# Patient Record
Sex: Female | Born: 1985 | Race: White | Hispanic: No | Marital: Married | Smoking: Never smoker
Health system: Southern US, Community
[De-identification: ages and names within clinical notes are randomized; demographics above are authoritative.]

## PROBLEM LIST (undated history)

## (undated) ENCOUNTER — Inpatient Hospital Stay (HOSPITAL_COMMUNITY): Payer: Self-pay

## (undated) DIAGNOSIS — Z789 Other specified health status: Secondary | ICD-10-CM

## (undated) DIAGNOSIS — R1031 Right lower quadrant pain: Secondary | ICD-10-CM

## (undated) DIAGNOSIS — N9489 Other specified conditions associated with female genital organs and menstrual cycle: Secondary | ICD-10-CM

## (undated) HISTORY — PX: ANTERIOR CRUCIATE LIGAMENT REPAIR: SHX115

---

## 2013-08-05 NOTE — L&D Delivery Note (Signed)
Delivery Note At 4:18 PM a viable and healthy female was delivered via  (Presentation: ROA).  APGAR: 9, 9; weight pending.   Placenta status: spontaneous, intact.  Cord:  with the following complications: none.  Cord pH: na  Anesthesia:  epidural Episiotomy: none Lacerations: none Suture Repair: none Est. Blood Loss (mL): 200  Mom to postpartum.  Baby to Couplet care / Skin to Skin.  Herschell Dimesichard J Nicey Krah 12/02/2013, 4:26 PM

## 2013-10-21 LAB — OB RESULTS CONSOLE HEPATITIS B SURFACE ANTIGEN: Hepatitis B Surface Ag: NEGATIVE

## 2013-10-21 LAB — OB RESULTS CONSOLE ANTIBODY SCREEN: ANTIBODY SCREEN: NEGATIVE

## 2013-10-21 LAB — OB RESULTS CONSOLE HIV ANTIBODY (ROUTINE TESTING): HIV: NONREACTIVE

## 2013-10-21 LAB — OB RESULTS CONSOLE RUBELLA ANTIBODY, IGM: Rubella: IMMUNE

## 2013-10-21 LAB — OB RESULTS CONSOLE GC/CHLAMYDIA
Chlamydia: NEGATIVE
Gonorrhea: NEGATIVE

## 2013-10-21 LAB — OB RESULTS CONSOLE ABO/RH: RH TYPE: POSITIVE

## 2013-10-21 LAB — OB RESULTS CONSOLE RPR: RPR: NONREACTIVE

## 2013-11-03 LAB — OB RESULTS CONSOLE GBS: GBS: POSITIVE

## 2013-11-29 ENCOUNTER — Encounter (HOSPITAL_COMMUNITY): Payer: Self-pay | Admitting: *Deleted

## 2013-11-29 ENCOUNTER — Telehealth (HOSPITAL_COMMUNITY): Payer: Self-pay | Admitting: *Deleted

## 2013-11-29 ENCOUNTER — Other Ambulatory Visit: Payer: Self-pay | Admitting: Obstetrics and Gynecology

## 2013-11-29 NOTE — Telephone Encounter (Signed)
Preadmission screen  

## 2013-12-02 ENCOUNTER — Observation Stay (HOSPITAL_COMMUNITY): Payer: BC Managed Care – PPO | Admitting: Anesthesiology

## 2013-12-02 ENCOUNTER — Inpatient Hospital Stay (HOSPITAL_COMMUNITY)
Admission: AD | Admit: 2013-12-02 | Payer: BC Managed Care – PPO | Source: Ambulatory Visit | Admitting: Obstetrics and Gynecology

## 2013-12-02 ENCOUNTER — Observation Stay (HOSPITAL_COMMUNITY)
Admission: RE | Admit: 2013-12-02 | Discharge: 2013-12-03 | Disposition: A | Discharge status: Home / Self Care | Payer: BC Managed Care – PPO | Source: Ambulatory Visit | Attending: Obstetrics and Gynecology | Admitting: Obstetrics and Gynecology

## 2013-12-02 ENCOUNTER — Encounter (HOSPITAL_COMMUNITY): Payer: BC Managed Care – PPO | Admitting: Anesthesiology

## 2013-12-02 ENCOUNTER — Encounter (HOSPITAL_COMMUNITY): Payer: Self-pay

## 2013-12-02 DIAGNOSIS — Z22330 Carrier of Group B streptococcus: Secondary | ICD-10-CM | POA: Diagnosis not present

## 2013-12-02 DIAGNOSIS — O99892 Other specified diseases and conditions complicating childbirth: Secondary | ICD-10-CM | POA: Diagnosis not present

## 2013-12-02 DIAGNOSIS — O9989 Other specified diseases and conditions complicating pregnancy, childbirth and the puerperium: Principal | ICD-10-CM

## 2013-12-02 DIAGNOSIS — O99891 Other specified diseases and conditions complicating pregnancy: Secondary | ICD-10-CM | POA: Diagnosis present

## 2013-12-02 LAB — CBC
HEMATOCRIT: 40 % (ref 36.0–46.0)
HEMOGLOBIN: 13.8 g/dL (ref 12.0–15.0)
MCH: 31.2 pg (ref 26.0–34.0)
MCHC: 34.5 g/dL (ref 30.0–36.0)
MCV: 90.5 fL (ref 78.0–100.0)
Platelets: 130 10*3/uL — ABNORMAL LOW (ref 150–400)
RBC: 4.42 MIL/uL (ref 3.87–5.11)
RDW: 13.8 % (ref 11.5–15.5)
WBC: 11.3 10*3/uL — ABNORMAL HIGH (ref 4.0–10.5)

## 2013-12-02 LAB — ABO/RH: ABO/RH(D): O POS

## 2013-12-02 LAB — TYPE AND SCREEN
ABO/RH(D): O POS
ANTIBODY SCREEN: NEGATIVE

## 2013-12-02 LAB — OB RESULTS CONSOLE GC/CHLAMYDIA
Chlamydia: NEGATIVE
GC PROBE AMP, GENITAL: NEGATIVE

## 2013-12-02 LAB — RPR

## 2013-12-02 LAB — OB RESULTS CONSOLE HIV ANTIBODY (ROUTINE TESTING): HIV: NONREACTIVE

## 2013-12-02 LAB — OB RESULTS CONSOLE RPR: RPR: NONREACTIVE

## 2013-12-02 MED ORDER — TETANUS-DIPHTH-ACELL PERTUSSIS 5-2.5-18.5 LF-MCG/0.5 IM SUSP: 0.5000 mL | Freq: Once | INTRAMUSCULAR | Status: DC

## 2013-12-02 MED ORDER — OXYCODONE-ACETAMINOPHEN 5-325 MG PO TABS: 1.0000 | ORAL_TABLET | ORAL | Status: DC | PRN

## 2013-12-02 MED ORDER — DIBUCAINE 1 % RE OINT
1.0000 | TOPICAL_OINTMENT | RECTAL | Status: DC | PRN
Start: 2013-12-02 — End: 2013-12-03

## 2013-12-02 MED ORDER — ACETAMINOPHEN 325 MG PO TABS: 650.0000 mg | ORAL_TABLET | ORAL | Status: DC | PRN

## 2013-12-02 MED ORDER — PRENATAL MULTIVITAMIN CH
1.0000 | ORAL_TABLET | Freq: Every day | ORAL | Status: DC
Start: 2013-12-03 — End: 2013-12-03
  Administered 2013-12-03: 1 via ORAL
  Filled 2013-12-02: qty 1

## 2013-12-02 MED ORDER — ZOLPIDEM TARTRATE 5 MG PO TABS: 5.0000 mg | ORAL_TABLET | Freq: Every evening | ORAL | Status: DC | PRN

## 2013-12-02 MED ORDER — PHENYLEPHRINE 40 MCG/ML (10ML) SYRINGE FOR IV PUSH (FOR BLOOD PRESSURE SUPPORT)
PREFILLED_SYRINGE | INTRAVENOUS | Status: AC
  Filled 2013-12-02: qty 10

## 2013-12-02 MED ORDER — FENTANYL 2.5 MCG/ML BUPIVACAINE 1/10 % EPIDURAL INFUSION (WH - ANES)
INTRAMUSCULAR | Status: DC | PRN
  Administered 2013-12-02: 14 mL/h via EPIDURAL

## 2013-12-02 MED ORDER — METHYLERGONOVINE MALEATE 0.2 MG PO TABS: 0.2000 mg | ORAL_TABLET | ORAL | Status: DC | PRN

## 2013-12-02 MED ORDER — LIDOCAINE HCL (PF) 1 % IJ SOLN
INTRAMUSCULAR | Status: DC | PRN
  Administered 2013-12-02 (×2): 8 mL

## 2013-12-02 MED ORDER — OXYTOCIN 40 UNITS IN LACTATED RINGERS INFUSION - SIMPLE MED
1.0000 m[IU]/min | INTRAVENOUS | Status: DC
  Administered 2013-12-02: 2 m[IU]/min via INTRAVENOUS
  Filled 2013-12-02: qty 1000

## 2013-12-02 MED ORDER — PHENYLEPHRINE 40 MCG/ML (10ML) SYRINGE FOR IV PUSH (FOR BLOOD PRESSURE SUPPORT)
80.0000 ug | PREFILLED_SYRINGE | INTRAVENOUS | Status: DC | PRN
Start: 2013-12-02 — End: 2013-12-02
  Filled 2013-12-02: qty 2

## 2013-12-02 MED ORDER — IBUPROFEN 600 MG PO TABS
600.0000 mg | ORAL_TABLET | Freq: Four times a day (QID) | ORAL | Status: DC | PRN
  Administered 2013-12-02: 600 mg via ORAL
  Filled 2013-12-02: qty 1

## 2013-12-02 MED ORDER — WITCH HAZEL-GLYCERIN EX PADS
1.0000 | MEDICATED_PAD | CUTANEOUS | Status: DC | PRN
Start: 2013-12-02 — End: 2013-12-03

## 2013-12-02 MED ORDER — PHENYLEPHRINE 40 MCG/ML (10ML) SYRINGE FOR IV PUSH (FOR BLOOD PRESSURE SUPPORT)
80.0000 ug | PREFILLED_SYRINGE | INTRAVENOUS | Status: DC | PRN
  Filled 2013-12-02: qty 2

## 2013-12-02 MED ORDER — ONDANSETRON HCL 4 MG PO TABS: 4.0000 mg | ORAL_TABLET | ORAL | Status: DC | PRN

## 2013-12-02 MED ORDER — METHYLERGONOVINE MALEATE 0.2 MG/ML IJ SOLN: 0.2000 mg | INTRAMUSCULAR | Status: DC | PRN

## 2013-12-02 MED ORDER — FENTANYL 2.5 MCG/ML BUPIVACAINE 1/10 % EPIDURAL INFUSION (WH - ANES): 14.0000 mL/h | INTRAMUSCULAR | Status: DC | PRN

## 2013-12-02 MED ORDER — DIPHENHYDRAMINE HCL 25 MG PO CAPS: 25.0000 mg | ORAL_CAPSULE | Freq: Four times a day (QID) | ORAL | Status: DC | PRN

## 2013-12-02 MED ORDER — SIMETHICONE 80 MG PO CHEW
80.0000 mg | CHEWABLE_TABLET | ORAL | Status: DC | PRN
Start: 2013-12-02 — End: 2013-12-03

## 2013-12-02 MED ORDER — DIPHENHYDRAMINE HCL 50 MG/ML IJ SOLN: 12.5000 mg | INTRAMUSCULAR | Status: DC | PRN

## 2013-12-02 MED ORDER — ONDANSETRON HCL 4 MG/2ML IJ SOLN: 4.0000 mg | INTRAMUSCULAR | Status: DC | PRN

## 2013-12-02 MED ORDER — EPHEDRINE 5 MG/ML INJ
10.0000 mg | INTRAVENOUS | Status: DC | PRN
  Filled 2013-12-02: qty 2

## 2013-12-02 MED ORDER — FENTANYL 2.5 MCG/ML BUPIVACAINE 1/10 % EPIDURAL INFUSION (WH - ANES)
INTRAMUSCULAR | Status: AC
  Filled 2013-12-02: qty 125

## 2013-12-02 MED ORDER — CITRIC ACID-SODIUM CITRATE 334-500 MG/5ML PO SOLN: 30.0000 mL | ORAL | Status: DC | PRN

## 2013-12-02 MED ORDER — LIDOCAINE HCL (PF) 1 % IJ SOLN
30.0000 mL | INTRAMUSCULAR | Status: DC | PRN
  Filled 2013-12-02: qty 30

## 2013-12-02 MED ORDER — FLEET ENEMA 7-19 GM/118ML RE ENEM: 1.0000 | ENEMA | RECTAL | Status: DC | PRN

## 2013-12-02 MED ORDER — LANOLIN HYDROUS EX OINT
TOPICAL_OINTMENT | CUTANEOUS | Status: DC | PRN
Start: 2013-12-02 — End: 2013-12-03

## 2013-12-02 MED ORDER — BENZOCAINE-MENTHOL 20-0.5 % EX AERO: 1.0000 "application " | INHALATION_SPRAY | CUTANEOUS | Status: DC | PRN

## 2013-12-02 MED ORDER — OXYTOCIN 40 UNITS IN LACTATED RINGERS INFUSION - SIMPLE MED: 62.5000 mL/h | INTRAVENOUS | Status: DC

## 2013-12-02 MED ORDER — LACTATED RINGERS IV SOLN: 500.0000 mL | INTRAVENOUS | Status: DC | PRN

## 2013-12-02 MED ORDER — BUTORPHANOL TARTRATE 1 MG/ML IJ SOLN: 1.0000 mg | INTRAMUSCULAR | Status: DC | PRN

## 2013-12-02 MED ORDER — LACTATED RINGERS IV SOLN
INTRAVENOUS | Status: DC
  Administered 2013-12-02: 125 mL/h via INTRAVENOUS

## 2013-12-02 MED ORDER — LACTATED RINGERS IV SOLN: 500.0000 mL | Freq: Once | INTRAVENOUS | Status: DC

## 2013-12-02 MED ORDER — IBUPROFEN 600 MG PO TABS
600.0000 mg | ORAL_TABLET | Freq: Four times a day (QID) | ORAL | Status: DC
Start: 2013-12-03 — End: 2013-12-03
  Administered 2013-12-02 – 2013-12-03 (×4): 600 mg via ORAL
  Filled 2013-12-02 (×4): qty 1

## 2013-12-02 MED ORDER — EPHEDRINE 5 MG/ML INJ
INTRAVENOUS | Status: AC
  Filled 2013-12-02: qty 4

## 2013-12-02 MED ORDER — OXYTOCIN BOLUS FROM INFUSION
500.0000 mL | INTRAVENOUS | Status: DC
  Administered 2013-12-02: 500 mL via INTRAVENOUS

## 2013-12-02 MED ORDER — ONDANSETRON HCL 4 MG/2ML IJ SOLN
4.0000 mg | Freq: Four times a day (QID) | INTRAMUSCULAR | Status: DC | PRN
  Administered 2013-12-02: 4 mg via INTRAVENOUS
  Filled 2013-12-02: qty 2

## 2013-12-02 MED ORDER — TERBUTALINE SULFATE 1 MG/ML IJ SOLN: 0.2500 mg | Freq: Once | INTRAMUSCULAR | Status: DC | PRN

## 2013-12-02 MED ORDER — PENICILLIN G POTASSIUM 5000000 UNITS IJ SOLR
5.0000 10*6.[IU] | Freq: Once | INTRAVENOUS | Status: AC
  Administered 2013-12-02: 5 10*6.[IU] via INTRAVENOUS
  Filled 2013-12-02: qty 5

## 2013-12-02 MED ORDER — PENICILLIN G POTASSIUM 5000000 UNITS IJ SOLR
2.5000 10*6.[IU] | INTRAVENOUS | Status: DC
  Administered 2013-12-02 (×2): 2.5 10*6.[IU] via INTRAVENOUS
  Filled 2013-12-02 (×5): qty 2.5

## 2013-12-02 MED ORDER — SENNOSIDES-DOCUSATE SODIUM 8.6-50 MG PO TABS
2.0000 | ORAL_TABLET | ORAL | Status: DC
  Administered 2013-12-02: 2 via ORAL
  Filled 2013-12-02: qty 2

## 2013-12-02 NOTE — Anesthesia Procedure Notes (Addendum)
Epidural Patient location during procedure: OB Start time: 12/02/2013 8:30 AM End time: 12/02/2013 8:34 AM  Staffing Anesthesiologist: Leilani AbleHATCHETT, Heitor Steinhoff  Preanesthetic Checklist Completed: patient identified, surgical consent, pre-op evaluation, timeout performed, IV checked, risks and benefits discussed and monitors and equipment checked  Epidural Patient position: sitting Prep: site prepped and draped and DuraPrep Patient monitoring: continuous pulse ox and blood pressure Approach: midline Location: L3-L4 Injection technique: LOR air  Needle:  Needle type: Tuohy  Needle gauge: 17 G Needle length: 9 cm and 9 Needle insertion depth: 5 cm cm Catheter type: closed end flexible Catheter size: 19 Gauge Catheter at skin depth: 10 cm Test dose: negative and Other  Assessment Sensory level: T9 Events: blood not aspirated, injection not painful, no injection resistance, negative IV test and no paresthesia  Additional Notes Reason for block:procedure for pain

## 2013-12-02 NOTE — H&P (Signed)
[name] is a 28 y.o. female presenting for induction. Maternal Medical History:  Contractions: Frequency: rare.   Perceived severity is mild.    Fetal activity: Perceived fetal activity is normal.   Last perceived fetal movement was within the past hour.    Prenatal complications: no prenatal complications Prenatal Complications - Diabetes: none.    OB History   Grav Para Term Preterm Abortions TAB SAB Ect Mult Living   2 1 1       1      History reviewed. No pertinent past medical history. Past Surgical History  Procedure Laterality Date  . Anterior cruciate ligament repair     Family History: family history includes Cancer in her paternal grandfather; Heart attack in her paternal grandfather; Hypertension in her mother. Social History:  reports that she has never smoked. She does not have any smokeless tobacco history on file. She reports that she does not drink alcohol or use illicit drugs.   Prenatal Transfer Tool  Maternal Diabetes: No Genetic Screening: Normal Maternal Ultrasounds/Referrals: Normal Fetal Ultrasounds or other Referrals:  None Maternal Substance Abuse:  No Significant Maternal Medications:  None Significant Maternal Lab Results:  Lab values include: Group B Strep positive Other Comments:  None  Review of Systems  All other systems reviewed and are negative.     Blood pressure 113/70, pulse 93, temperature 98.1 F (36.7 C), temperature source Oral, resp. rate 18, height 5' (1.524 m), weight 64.864 kg (143 lb). Maternal Exam:  Uterine Assessment: Contraction strength is mild.  Contraction frequency is rare.   Abdomen: Patient reports no abdominal tenderness. Fetal presentation: vertex  Introitus: Normal vulva. Normal vagina.  Ferning test: not done.  Nitrazine test: not done. Amniotic fluid character: not assessed.  Pelvis: adequate for delivery.   Cervix: Cervix evaluated by digital exam.     Physical Exam  Constitutional: She is  oriented to person, place, and time. She appears well-developed and well-nourished.  HENT:  Head: Normocephalic and atraumatic.  Cardiovascular: Normal rate and regular rhythm.   Respiratory: Effort normal and breath sounds normal.  GI: Soft. Bowel sounds are normal.  Genitourinary: Vagina normal and uterus normal.  Neurological: She is alert and oriented to person, place, and time. She has normal reflexes.  Skin: Skin is warm and dry.  Psychiatric: She has a normal mood and affect. Her behavior is normal.    Prenatal labs: ABO, Rh: O/Positive/-- (03/19 0000) Antibody: Negative (03/19 0000) Rubella: Immune (03/19 0000) RPR: Nonreactive (03/19 0000)  HBsAg: Negative (03/19 0000)  HIV: Non-reactive (03/19 0000)  GBS: Positive (04/01 0000)   Assessment/Plan: Induction at 40 weeks History of precipitous labor   Laurie AdenRichard Wood [name] 12/02/2013, 7:12 AM

## 2013-12-02 NOTE — Progress Notes (Signed)
Gala LewandowskyJessica Ethridge is a 28 y.o. G2P1001 at 1590w0d by LMP admitted for induction of labor due to Prior precipitous labor and Elective at term.  Subjective: comfortable  Objective: BP 116/75  Pulse 74  Temp(Src) 98.2 F (36.8 C) (Oral)  Resp 20  Ht 5' (1.524 m)  Wt 64.864 kg (143 lb)  BMI 27.93 kg/m2  SpO2 99%      FHT:  FHR: 135 bpm, variability: moderate,  accelerations:  Present,  decelerations:  Absent UC:   regular, every 3 minutes SVE:   Dilation: 5 Effacement (%): 60 Station: -1 Exam by:: s grindstaff rn  Labs: Lab Results  Component Value Date   WBC 11.3* 12/02/2013   HGB 13.8 12/02/2013   HCT 40.0 12/02/2013   MCV 90.5 12/02/2013   PLT 130* 12/02/2013    Assessment / Plan: Induction of labor due to above noted indications,  progressing well on pitocin  Labor: Progressing normally Preeclampsia:  no signs or symptoms of toxicity Fetal Wellbeing:  Category I Pain Control:  Epidural I/D:  n/a Anticipated MOD:  NSVD  Lenoard AdenRichard J Wilhemenia Camba 12/02/2013, 1:41 PM

## 2013-12-02 NOTE — Anesthesia Preprocedure Evaluation (Signed)

## 2013-12-03 DIAGNOSIS — O99892 Other specified diseases and conditions complicating childbirth: Secondary | ICD-10-CM | POA: Diagnosis not present

## 2013-12-03 LAB — CBC
HEMATOCRIT: 37.7 % (ref 36.0–46.0)
HEMOGLOBIN: 12.9 g/dL (ref 12.0–15.0)
MCH: 31.2 pg (ref 26.0–34.0)
MCHC: 34.2 g/dL (ref 30.0–36.0)
MCV: 91.3 fL (ref 78.0–100.0)
Platelets: 103 10*3/uL — ABNORMAL LOW (ref 150–400)
RBC: 4.13 MIL/uL (ref 3.87–5.11)
RDW: 14 % (ref 11.5–15.5)
WBC: 12.8 10*3/uL — ABNORMAL HIGH (ref 4.0–10.5)

## 2013-12-03 LAB — CCBB MATERNAL DONOR DRAW

## 2013-12-03 MED ORDER — OXYCODONE-ACETAMINOPHEN 5-325 MG PO TABS: 1.0000 | ORAL_TABLET | ORAL | Status: DC | PRN

## 2013-12-03 MED ORDER — IBUPROFEN 600 MG PO TABS: 600.0000 mg | ORAL_TABLET | Freq: Four times a day (QID) | ORAL | Status: DC

## 2013-12-03 NOTE — Discharge Summary (Signed)
Obstetric Discharge Summary  Reason for Admission: Pt is a G2P2002 at 1723w0d  [facility] wks, with Dr. Billy Coastaavon as primary provider.  Medications on Admission: Prescriptions prior to admission  Medication Sig Dispense Refill  . calcium carbonate (TUMS - DOSED IN MG ELEMENTAL CALCIUM) 500 MG chewable tablet Chew 1 tablet by mouth as needed for indigestion or heartburn.      . polyethylene glycol (MIRALAX / GLYCOLAX) packet Take 17 g by mouth every other day.      . Prenatal Vit-Fe Fumarate-FA (PRENATAL MULTIVITAMIN) TABS tablet Take 1 tablet by mouth daily at 12 noon.        Prenatal Labs: ABO, Rh: --/--/O POS, O POS (04/30 0650) Antibody: NEG (04/30 0650) Rubella: Immune (03/19 0000)  RPR: NON REAC (04/30 0650)  HBsAg: Negative (03/19 0000)  HIV: Non-reactive (04/30 0000)  GTT : see transfer prenatal records GBS: Positive (04/01 0000)   Prenatal Procedures: ultrasound Intrapartum Procedures: spontaneous vaginal delivery Postpartum Procedures: none Complications-Operative and Postpartum: none  Labs: Hemoglobin  Date Value Ref Range Status  12/03/2013 12.9  12.0 - 15.0 g/dL Final     HCT  Date Value Ref Range Status  12/03/2013 37.7  36.0 - 46.0 % Final   Lab Results  Component Value Date   PLT 103* 12/03/2013    Newborn Data: Live born female  Birth Weight: 7 lb 12 oz (3515 g) APGAR: 9, 9  Home with mother.   Discharge Information: Date: 12/03/2013 Discharge Diagnoses:  Pt is a G2P2002 at 2523w0d S/P Term Pregnancy-delivered on 12/02/2013  Condition: stable Activity: pelvic rest Diet: routine Medications:    Medication List    ASK your doctor about these medications       calcium carbonate 500 MG chewable tablet  Commonly known as:  TUMS - dosed in mg elemental calcium  Chew 1 tablet by mouth as needed for indigestion or heartburn.     polyethylene glycol packet  Commonly known as:  MIRALAX / GLYCOLAX  Take 17 g by  mouth every other day.     prenatal multivitamin Tabs tablet  Take 1 tablet by mouth daily at 12 noon.       Instructions: [facility] instruction booklet has been given and reviewed Discharge to: home     Follow-up Information   Follow up with Lenoard AdenAAVON,RICHARD J, MD. Schedule an appointment as soon as possible for a visit in 6 weeks. (For postpartum visit)    Specialty:  Obstetrics and Gynecology   Contact information:   [address] [place] [id number] [phone]       Kenard Gowerolitta M Christy Ehrsam, MSN, CNM 12/03/2013, 7:00 PM

## 2013-12-03 NOTE — Discharge Instructions (Signed)
Postpartum Depression and Baby Blues °The postpartum period begins right after the birth of a baby. During this time, there is often a great amount of joy and excitement. It is also a time of considerable changes in the life of the parent(s). Regardless of how many times a mother gives birth, each child brings new challenges and dynamics to the family. It is not unusual to have feelings of excitement accompanied by confusing shifts in moods, emotions, and thoughts. All mothers are at risk of developing postpartum depression or the "baby blues." These mood changes can occur right after giving birth, or they may occur many months after giving birth. The baby blues or postpartum depression can be mild or severe. Additionally, postpartum depression can resolve rather quickly, or it can be a long-term condition. °CAUSES °Elevated hormones and their rapid decline are thought to be a main cause of postpartum depression and the baby blues. There are a number of hormones that radically change during and after pregnancy. Estrogen and progesterone usually decrease immediately after delivering your baby. The level of thyroid hormone and various cortisol steroids also rapidly drop. Other factors that play a major role in these changes include major life events and genetics.  °RISK FACTORS °If you have any of the following risks for the baby blues or postpartum depression, know what symptoms to watch out for during the postpartum period. Risk factors that may increase the likelihood of getting the baby blues or postpartum depression include: °· Having a personal or family history of depression. °· Having depression while being pregnant. °· Having premenstrual or oral contraceptive-associated mood issues. °· Having exceptional life stress. °· Having marital conflict. °· Lacking a social support network. °· Having a baby with special needs. °· Having health problems such as diabetes. °SYMPTOMS °Baby blues symptoms include: °· Brief  fluctuations in mood, such as going from extreme happiness to sadness. °· Decreased concentration. °· Difficulty sleeping. °· Crying spells, tearfulness. °· Irritability. °· Anxiety. °Postpartum depression symptoms typically begin within the first month after giving birth. These symptoms include: °· Difficulty sleeping or excessive sleepiness. °· Marked weight loss. °· Agitation. °· Feelings of worthlessness. °· Lack of interest in activity or food. °Postpartum psychosis is a very concerning condition and can be dangerous. Fortunately, it is rare. Displaying any of the following symptoms is cause for immediate medical attention. Postpartum psychosis symptoms include: °· Hallucinations and delusions. °· Bizarre or disorganized behavior. °· Confusion or disorientation. °DIAGNOSIS  °A diagnosis is made by an evaluation of your symptoms. There are no medical or lab tests that lead to a diagnosis, but there are various questionnaires that a caregiver may use to identify those with the baby blues, postpartum depression, or psychosis. Often times, a screening tool called the Edinburgh Postnatal Depression Scale is used to diagnose depression in the postpartum period.  °TREATMENT °The baby blues usually goes away on its own in 1 to 2 weeks. Social support is often all that is needed. You should be encouraged to get adequate sleep and rest. Occasionally, you may be given medicines to help you sleep.  °Postpartum depression requires treatment as it can last several months or longer if it is not treated. Treatment may include individual or group therapy, medicine, or both to address any social, physiological, and psychological factors that may play a role in the depression. Regular exercise, a healthy diet, rest, and social support may also be strongly recommended.  °Postpartum psychosis is more serious and needs treatment right away. Hospitalization is   often needed. °HOME CARE INSTRUCTIONS °· Get as much rest as you can. Nap  when the baby sleeps. °· Exercise regularly. Some women find yoga and walking to be beneficial. °· Eat a balanced and nourishing diet. °· Do little things that you enjoy. Have a cup of tea, take a bubble bath, read your favorite magazine, or listen to your favorite music. °· Avoid alcohol. °· Ask for help with household chores, cooking, grocery shopping, or running errands as needed. Do not try to do everything. °· Talk to people close to you about how you are feeling. Get support from your partner, family members, friends, or other new moms. °· Try to stay positive in how you think. Think about the things you are grateful for. °· Do not spend a lot of time alone. °· Only take medicine as directed by your caregiver. °· Keep all your postpartum appointments. °· Let your caregiver know if you have any concerns. °SEEK MEDICAL CARE IF: °You are having a reaction or problems with your medicine. °SEEK IMMEDIATE MEDICAL CARE IF: °· You have suicidal feelings. °· You feel you may harm the baby or someone else. °Document Released: 04/25/2004 Document Revised: 10/14/2011 Document Reviewed: 05/03/2013 °[employer]® Patient Information ©2014 [employer]. °Breastfeeding and Mastitis °Mastitis is inflammation of the breast tissue. It can occur in women who are breastfeeding. This can make breastfeeding painful. Mastitis will sometimes go away on its own. Your health care provider will help determine if treatment is needed. °CAUSES °Mastitis is often associated with a blocked milk (lactiferous) duct. This can happen when too much milk builds up in the breast. Causes of excess milk in the breast can include: °· Poor latch-on. If your baby is not latched onto the breast properly, she or he may not empty your breast completely while breastfeeding. °· Allowing too much time to pass between feedings. °· Wearing a bra or other clothing that is too tight. This puts extra pressure on the lactiferous ducts so milk does not flow through them  as it should. °Mastitis can also be caused by a bacterial infection. Bacteria may enter the breast tissue through cuts or openings in the skin. In women who are breastfeeding, this may occur because of cracked or irritated skin. Cracks in the skin are often caused when your baby does not latch on properly to the breast. °SIGNS AND SYMPTOMS °· Swelling, redness, tenderness, and pain in an area of the breast. °· Swelling of the glands under the arm on the same side. °· Fever may or may not accompany mastitis. °If an infection is allowed to progress, a collection of pus (abscess) may develop. °DIAGNOSIS  °Your health care provider can usually diagnose mastitis based on your symptoms and a physical exam. Tests may be done to help confirm the diagnosis. These may include: °· Removal of pus from the breast by applying pressure to the area. This pus can be examined in the lab to determine which bacteria are present. If an abscess has developed, the fluid in the abscess can be removed with a needle. This can also be used to confirm the diagnosis and determine the bacteria present. In most cases, pus will not be present. °· Blood tests to determine if your body is fighting a bacterial infection. °· Mammogram or ultrasound tests to rule out other problems or diseases. °TREATMENT  °Mastitis that occurs with breastfeeding will sometimes go away on its own. Your health care provider may choose to wait 24 hours after first seeing   you to decide whether a prescription medicine is needed. If your symptoms are worse after 24 hours, your health care provider will likely prescribe an antibiotic to treat the mastitis. He or she will determine which bacteria are most likely causing the infection and will then select an appropriate antibiotic. This is sometimes changed based on the results of tests performed to identify the bacteria, or if there is no response to the antibiotic selected. Antibiotics are usually given by mouth. You may  also be given medicine for pain. °HOME CARE INSTRUCTIONS °· Only take over-the-counter or prescription medicines for pain, fever, or discomfort as directed by your health care provider. °· If your health care provider prescribed an antibiotic, take the medicine as directed. Make sure you finish it even if you start to feel better. °· Do not wear a tight or underwire bra. Wear a soft, supportive bra. °· Increase your fluid intake, especially if you have a fever. °· Continue to empty the breast. Your health care provider can tell you whether this milk is safe for your infant or needs to be thrown out. You may be told to stop nursing until your health care provider thinks it is safe for your baby. Use a breast pump if you are advised to stop nursing. °· Keep your nipples clean and dry. °· Empty the first breast completely before going to the other breast. If your baby is not emptying your breasts completely for some reason, use a breast pump to empty your breasts. °· If you go back to work, pump your breasts while at work to stay in time with your nursing schedule. °· Avoid allowing your breasts to become overly filled with milk (engorged). °SEEK MEDICAL CARE IF: °· You have pus-like discharge from the breast. °· Your symptoms do not improve with the treatment prescribed by your health care provider within 2 days. °SEEK IMMEDIATE MEDICAL CARE IF: °· Your pain and swelling are getting worse. °· You have pain that is not controlled with medicine. °· You have a red line extending from the breast toward your armpit. °· You have a fever or persistent symptoms for more than 2 3 days. °· You have a fever and your symptoms suddenly get worse. °MAKE SURE YOU:  °· Understand these instructions. °· Will watch your condition. °· Will get help right away if you are not doing well or get worse. °Document Released: 11/16/2004 Document Revised: 03/24/2013 Document Reviewed: 02/25/2013 °[employer]® Patient Information ©2014 [employer]. °Breastfeeding °Deciding to breastfeed is one of the best choices you can make for you and your baby. A change in hormones during pregnancy causes your breast tissue to grow and increases the number and size of your milk ducts. These hormones also allow proteins, sugars, and fats from your blood supply to make breast milk in your milk-producing glands. Hormones prevent breast milk from being released before your baby is born as well as prompt milk flow after birth. Once breastfeeding has begun, thoughts of your baby, as well as his or her sucking or crying, can stimulate the release of milk from your milk-producing glands.  °BENEFITS OF BREASTFEEDING °For Your Baby °· Your first milk (colostrum) helps your baby's digestive system function better.   °· There are antibodies in your milk that help your baby fight off infections.   °· Your baby has a lower incidence of asthma, allergies, and sudden infant death syndrome.   °· The nutrients in breast milk are better for your baby than infant formulas and   are designed uniquely for your baby's needs.   °· Breast milk improves your baby's brain development.   °· Your baby is less likely to develop other conditions, such as childhood obesity, asthma, or type 2 diabetes mellitus.   °For You  °· Breastfeeding helps to create a very special bond between you and your baby.   °· Breastfeeding is convenient. Breast milk is always available at the correct temperature and costs nothing.   °· Breastfeeding helps to burn calories and helps you lose the weight gained during pregnancy.   °· Breastfeeding makes your uterus contract to its prepregnancy size faster and slows bleeding (lochia) after you give birth.   °· Breastfeeding helps to lower your risk of developing type 2 diabetes mellitus, osteoporosis, and breast or ovarian cancer later in life. °SIGNS THAT YOUR BABY IS HUNGRY °Early Signs of Hunger  °· Increased alertness or activity. °· Stretching. °· Movement of the head  from side to side. °· Movement of the head and opening of the mouth when the corner of the mouth or cheek is stroked (rooting). °· Increased sucking sounds, smacking lips, cooing, sighing, or squeaking. °· Hand-to-mouth movements. °· Increased sucking of fingers or hands. °Late Signs of Hunger °· Fussing. °· Intermittent crying. °Extreme Signs of Hunger °Signs of extreme hunger will require calming and consoling before your baby will be able to breastfeed successfully. Do not wait for the following signs of extreme hunger to occur before you initiate breastfeeding:   °· Restlessness. °· A loud, strong cry. °·  Screaming. °BREASTFEEDING BASICS °Breastfeeding Initiation °· Find a comfortable place to sit or lie down, with your neck and back well supported. °· Place a pillow or rolled up blanket under your baby to bring him or her to the level of your breast (if you are seated). Nursing pillows are specially designed to help support your arms and your baby while you breastfeed. °· Make sure that your baby's abdomen is facing your abdomen.   °· Gently massage your breast. With your fingertips, massage from your chest wall toward your nipple in a circular motion. This encourages milk flow. You may need to continue this action during the feeding if your milk flows slowly. °· Support your breast with 4 fingers underneath and your thumb above your nipple. Make sure your fingers are well away from your nipple and your baby's mouth.   °· Stroke your baby's lips gently with your finger or nipple.   °· When your baby's mouth is open wide enough, quickly bring your baby to your breast, placing your entire nipple and as much of the colored area around your nipple (areola) as possible into your baby's mouth.   °· More areola should be visible above your baby's upper lip than below the lower lip.   °· Your baby's tongue should be between his or her lower gum and your breast.   °· Ensure that your baby's mouth is correctly  positioned around your nipple (latched). Your baby's lips should create a seal on your breast and be turned out (everted). °· It is common for your baby to suck about 2 3 minutes in order to start the flow of breast milk. °Latching °Teaching your baby how to latch on to your breast properly is very important. An improper latch can cause nipple pain and decreased milk supply for you and poor weight gain in your baby. Also, if your baby is not latched onto your nipple properly, he or she may swallow some air during feeding. This can make your baby fussy. Burping your baby when you   switch breasts during the feeding can help to get rid of the air. However, teaching your baby to latch on properly is still the best way to prevent fussiness from swallowing air while breastfeeding. °Signs that your baby has successfully latched on to your nipple:    °· Silent tugging or silent sucking, without causing you pain.   °· Swallowing heard between every 3 4 sucks.   °·  Muscle movement above and in front of his or her ears while sucking.   °Signs that your baby has not successfully latched on to nipple:  °· Sucking sounds or smacking sounds from your baby while breastfeeding. °· Nipple pain. °If you think your baby has not latched on correctly, slip your finger into the corner of your baby's mouth to break the suction and place it between your baby's gums. Attempt breastfeeding initiation again. °Signs of Successful Breastfeeding °Signs from your baby:   °· A gradual decrease in the number of sucks or complete cessation of sucking.   °· Falling asleep.   °· Relaxation of his or her body.   °· Retention of a small amount of milk in his or her mouth.   °· Letting go of your breast by himself or herself. °Signs from you: °· Breasts that have increased in firmness, weight, and size 1 3 hours after feeding.   °· Breasts that are softer immediately after breastfeeding. °· Increased milk volume, as well as a change in milk consistency  and color by the 5th day of breastfeeding.   °· Nipples that are not sore, cracked, or bleeding. °Signs That Your Baby is Getting Enough Milk °· Wetting at least 3 diapers in a 24-hour period. The urine should be clear and pale yellow by age 5 days. °· At least 3 stools in a 24-hour period by age 5 days. The stool should be soft and yellow. °· At least 3 stools in a 24-hour period by age 7 days. The stool should be seedy and yellow. °· No loss of weight greater than 10% of birth weight during the first 3 days of age. °· Average weight gain of 4 7 ounces (120 210 mL) per week after age 4 days. °· Consistent daily weight gain by age 5 days, without weight loss after the age of 2 weeks. °After a feeding, your baby may spit up a small amount. This is common. °BREASTFEEDING FREQUENCY AND DURATION °Frequent feeding will help you make more milk and can prevent sore nipples and breast engorgement. Breastfeed when you feel the need to reduce the fullness of your breasts or when your baby shows signs of hunger. This is called "breastfeeding on demand." Avoid introducing a pacifier to your baby while you are working to establish breastfeeding (the first 4 6 weeks after your baby is born). After this time you may choose to use a pacifier. Research has shown that pacifier use during the first year of a baby's life decreases the risk of sudden infant death syndrome (SIDS). °Allow your baby to feed on each breast as long as he or she wants. Breastfeed until your baby is finished feeding. When your baby unlatches or falls asleep while feeding from the first breast, offer the second breast. Because newborns are often sleepy in the first few weeks of life, you may need to awaken your baby to get him or her to feed. °Breastfeeding times will vary from baby to baby. However, the following rules can serve as a guide to help you ensure that your baby is properly fed: °· Newborns (babies 4 weeks   of age or younger) may breastfeed every 1 3  hours. °· Newborns should not go longer than 3 hours during the day or 5 hours during the night without breastfeeding. °· You should breastfeed your baby a minimum of 8 times in a 24-hour period until you begin to introduce solid foods to your baby at around 6 months of age. °BREAST MILK PUMPING °Pumping and storing breast milk allows you to ensure that your baby is exclusively fed your breast milk, even at times when you are unable to breastfeed. This is especially important if you are going back to work while you are still breastfeeding or when you are not able to be present during feedings. Your lactation consultant can give you guidelines on how long it is safe to store breast milk.  °A breast pump is a machine that allows you to pump milk from your breast into a sterile bottle. The pumped breast milk can then be stored in a refrigerator or freezer. Some breast pumps are operated by hand, while others use electricity. Ask your lactation consultant which type will work best for you. Breast pumps can be purchased, but some hospitals and breastfeeding support groups lease breast pumps on a monthly basis. A lactation consultant can teach you how to hand express breast milk, if you prefer not to use a pump.  °CARING FOR YOUR BREASTS WHILE YOU BREASTFEED °Nipples can become dry, cracked, and sore while breastfeeding. The following recommendations can help keep your breasts moisturized and healthy: °· Avoid using soap on your nipples.   °· Wear a supportive bra. Although not required, special nursing bras and tank tops are designed to allow access to your breasts for breastfeeding without taking off your entire bra or top. Avoid wearing underwire style bras or extremely tight bras. °· Air dry your nipples for 3 4 minutes after each feeding.   °· Use only cotton bra pads to absorb leaked breast milk. Leaking of breast milk between feedings is normal.   °· Use lanolin on your nipples after breastfeeding. Lanolin helps to  maintain your skin's normal moisture barrier. If you use pure lanolin you do not need to wash it off before feeding your baby again. Pure lanolin is not toxic to your baby. You may also hand express a few drops of breast milk and gently massage that milk into your nipples and allow the milk to air dry. °In the first few weeks after giving birth, some women experience extremely full breasts (engorgement). Engorgement can make your breasts feel heavy, warm, and tender to the touch. Engorgement peaks within 3 5 days after you give birth. The following recommendations can help ease engorgement: °· Completely empty your breasts while breastfeeding or pumping. You may want to start by applying warm, moist heat (in the shower or with warm water-soaked hand towels) just before feeding or pumping. This increases circulation and helps the milk flow. If your baby does not completely empty your breasts while breastfeeding, pump any extra milk after he or she is finished. °· Wear a snug bra (nursing or regular) or tank top for 1 2 days to signal your body to slightly decrease milk production. °· Apply ice packs to your breasts, unless this is too uncomfortable for you. °· Make sure that your baby is latched on and positioned properly while breastfeeding. °If engorgement persists after 48 hours of following these recommendations, contact your health care provider or a lactation consultant. °OVERALL HEALTH CARE RECOMMENDATIONS WHILE BREASTFEEDING °· Eat healthy foods. Alternate between meals   and snacks, eating 3 of each per day. Because what you eat affects your breast milk, some of the foods may make your baby more irritable than usual. Avoid eating these foods if you are sure that they are negatively affecting your baby. °· Drink milk, fruit juice, and water to satisfy your thirst (about 10 glasses a day).   °· Rest often, relax, and continue to take your prenatal vitamins to prevent fatigue, stress, and anemia. °· Continue  breast self-awareness checks. °· Avoid chewing and smoking tobacco. °· Avoid alcohol and drug use. °Some medicines that may be harmful to your baby can pass through breast milk. It is important to ask your health care provider before taking any medicine, including all over-the-counter and prescription medicine as well as vitamin and herbal supplements. °It is possible to become pregnant while breastfeeding. If birth control is desired, ask your health care provider about options that will be safe for your baby. °SEEK MEDICAL CARE IF:  °· You feel like you want to stop breastfeeding or have become frustrated with breastfeeding. °· You have painful breasts or nipples. °· Your nipples are cracked or bleeding. °· Your breasts are red, tender, or warm. °· You have a swollen area on either breast. °· You have a fever or chills. °· You have nausea or vomiting. °· You have drainage other than breast milk from your nipples. °· Your breasts do not become full before feedings by the 5th day after you give birth. °· You feel sad and depressed. °· Your baby is too sleepy to eat well. °· Your baby is having trouble sleeping.   °· Your baby is wetting less than 3 diapers in a 24-hour period. °· Your baby has less than 3 stools in a 24-hour period. °· Your baby's skin or the white part of his or her eyes becomes yellow.   °· Your baby is not gaining weight by 5 days of age. °SEEK IMMEDIATE MEDICAL CARE IF:  °· Your baby is overly tired (lethargic) and does not want to wake up and feed. °· Your baby develops an unexplained fever. °Document Released: 07/22/2005 Document Revised: 03/24/2013 Document Reviewed: 01/13/2013 °[employer]® Patient Information ©2014 [employer]. ° °

## 2013-12-03 NOTE — Anesthesia Postprocedure Evaluation (Signed)
  Anesthesia Post-op Note  Patient: Laurie LewandowskyJessica Wood  Procedure(s) Performed: * No procedures listed *  Patient Location: Mother/Baby  Anesthesia Type:Epidural  Level of Consciousness: awake, oriented and patient cooperative  Airway and Oxygen Therapy: Patient Spontanous Breathing  Post-op Pain: none  Post-op Assessment: Patient's Cardiovascular Status Stable, Respiratory Function Stable, Patent Airway, No signs of Nausea or vomiting, Adequate PO intake, Pain level controlled, No headache, No backache, No residual numbness and No residual motor weakness  Post-op Vital Signs: Reviewed and stable  Last Vitals:  Filed Vitals:   12/03/13 0658  BP: 98/59  Pulse: 73  Temp: 36.3 C  Resp: 17    Complications: No apparent anesthesia complications

## 2013-12-03 NOTE — Progress Notes (Signed)
Patient ID: [name], female   DOB: 08/30/1985, 28 y.o.   MRN: [id number] PPD # 1 SVD  S:  Reports feeling tired and a little sore             Tolerating po/ No nausea or vomiting             Bleeding is light             Pain controlled with ibuprofen (OTC)             Up ad lib / ambulatory / voiding without difficulties    Newborn  Information for the patient's newborn:  [name], Girl Hadli [[id number]][[id number]]  female  breast feeding   O:  A & O x 3, in no apparent distress              VS:  Filed Vitals:   12/02/13 1859 12/02/13 1930 12/02/13 1959 12/03/13 0658  BP: 118/66 132/68 129/81 98/59  Pulse: 79 73 80 73  Temp:    97.4 F (36.3 C)  TempSrc:    Oral  Resp: 16 18 18 17   Height:      Weight:      SpO2:    100%    LABS:  Recent Labs  12/02/13 0650 12/03/13 0845  WBC 11.3* 12.8*  HGB 13.8 12.9  HCT 40.0 37.7  PLT 130* 103*    Blood type: --/--/O POS, O POS (04/30 0650)  Rubella: Immune (03/19 0000)   I&O: I/O last 3 completed shifts: In: -  Out: 850 [Urine:650; Blood:200]             Lungs: Clear and unlabored  Heart: regular rate and rhythm / no murmurs  Abdomen: soft, non-tender, non-distended             Fundus: firm, non-tender, U-2  Perineum: intact, no edema  Lochia: minimal  Extremities: no edema, no calf pain or tenderness, no Homans    A/P: PPD # 1  28 y.o., [id number]   Principal Problem:    Postpartum care following vaginal delivery (4/30)   Doing well - stable status  Routine post partum orders  Anticipate discharge tomorrow    Kenard Gowerolitta M Earnest Mcgillis, MSN, CNM 12/03/2013, 10:10 AM

## 2013-12-03 NOTE — Lactation Note (Signed)
This note was copied from the chart of Laurie Gala LewandowskyJessica Raske. Lactation Consultation Note Experienced BF mom for 1 yr. For 1st. Child. Denies needing assistance in BF. States going well. Mom made aware of O/P services, breastfeeding support groups, community resources, and our phone # for post-discharge questions. Mother informed of post-discharge support and given phone number to the lactation department, including services for phone call assistance; out-patient appointments; and breastfeeding support group. List of other breastfeeding resources in the community given in the handout. Encouraged mother to call for problems or concerns related to breastfeeding. Patient Name: [name] ZOXWR'UToday's Date: 12/03/2013     Maternal Data    Feeding Feeding Type: Breast Fed Length of feed: 7 min  [facility]Interventions                      Lactation Tools Discussed/Used     Consult Status      Charyl DancerLaura G Tyrisha Benninger 12/03/2013, 2:45 AM

## 2013-12-24 ENCOUNTER — Other Ambulatory Visit: Payer: Self-pay | Admitting: Obstetrics and Gynecology

## 2013-12-24 DIAGNOSIS — N6310 Unspecified lump in the right breast, unspecified quadrant: Secondary | ICD-10-CM

## 2013-12-30 ENCOUNTER — Ambulatory Visit
Admission: RE | Admit: 2013-12-30 | Discharge: 2013-12-30 | Disposition: A | Discharge status: Home / Self Care | Payer: BC Managed Care – PPO | Source: Ambulatory Visit | Attending: Obstetrics and Gynecology | Admitting: Obstetrics and Gynecology

## 2013-12-30 DIAGNOSIS — N6310 Unspecified lump in the right breast, unspecified quadrant: Secondary | ICD-10-CM

## 2014-06-06 ENCOUNTER — Encounter (HOSPITAL_COMMUNITY): Payer: Self-pay

## 2016-02-18 ENCOUNTER — Encounter (HOSPITAL_COMMUNITY)
Disposition: A | Discharge status: Home / Self Care | Payer: Self-pay | Source: Ambulatory Visit | Attending: Obstetrics & Gynecology

## 2016-02-18 ENCOUNTER — Observation Stay (HOSPITAL_COMMUNITY)
Admission: AD | Admit: 2016-02-18 | Discharge: 2016-02-19 | Disposition: A | Discharge status: Home / Self Care | Payer: BLUE CROSS/BLUE SHIELD | Source: Ambulatory Visit | Attending: Obstetrics and Gynecology | Admitting: Obstetrics and Gynecology

## 2016-02-18 ENCOUNTER — Inpatient Hospital Stay (HOSPITAL_COMMUNITY): Payer: BLUE CROSS/BLUE SHIELD

## 2016-02-18 ENCOUNTER — Encounter (HOSPITAL_COMMUNITY): Payer: Self-pay | Admitting: *Deleted

## 2016-02-18 ENCOUNTER — Inpatient Hospital Stay (HOSPITAL_COMMUNITY)
Admit: 2016-02-18 | Discharge: 2016-02-18 | Disposition: A | Discharge status: Home / Self Care | Payer: BLUE CROSS/BLUE SHIELD | Source: Ambulatory Visit | Attending: Obstetrics & Gynecology | Admitting: Obstetrics & Gynecology

## 2016-02-18 ENCOUNTER — Encounter (HOSPITAL_COMMUNITY): Payer: Self-pay | Admitting: Certified Nurse Midwife

## 2016-02-18 ENCOUNTER — Encounter (HOSPITAL_COMMUNITY): Payer: Self-pay | Admitting: Anesthesiology

## 2016-02-18 DIAGNOSIS — O3481 Maternal care for other abnormalities of pelvic organs, first trimester: Secondary | ICD-10-CM | POA: Insufficient documentation

## 2016-02-18 DIAGNOSIS — R1031 Right lower quadrant pain: Secondary | ICD-10-CM | POA: Diagnosis present

## 2016-02-18 DIAGNOSIS — R109 Unspecified abdominal pain: Secondary | ICD-10-CM

## 2016-02-18 DIAGNOSIS — N831 Corpus luteum cyst of ovary, unspecified side: Secondary | ICD-10-CM | POA: Diagnosis not present

## 2016-02-18 DIAGNOSIS — N9489 Other specified conditions associated with female genital organs and menstrual cycle: Secondary | ICD-10-CM | POA: Diagnosis present

## 2016-02-18 DIAGNOSIS — N949 Unspecified condition associated with female genital organs and menstrual cycle: Secondary | ICD-10-CM

## 2016-02-18 DIAGNOSIS — Z3A01 Less than 8 weeks gestation of pregnancy: Secondary | ICD-10-CM | POA: Insufficient documentation

## 2016-02-18 DIAGNOSIS — O26899 Other specified pregnancy related conditions, unspecified trimester: Secondary | ICD-10-CM

## 2016-02-18 DIAGNOSIS — R1900 Intra-abdominal and pelvic swelling, mass and lump, unspecified site: Secondary | ICD-10-CM | POA: Diagnosis present

## 2016-02-18 DIAGNOSIS — O0091 Unspecified ectopic pregnancy with intrauterine pregnancy: Secondary | ICD-10-CM

## 2016-02-18 HISTORY — DX: Other specified conditions associated with female genital organs and menstrual cycle: N94.89

## 2016-02-18 HISTORY — DX: Other specified health status: Z78.9

## 2016-02-18 HISTORY — DX: Right lower quadrant pain: R10.31

## 2016-02-18 LAB — CBC
HCT: 34.7 % — ABNORMAL LOW (ref 36.0–46.0)
HEMATOCRIT: 35.7 % — AB (ref 36.0–46.0)
HEMOGLOBIN: 12.3 g/dL (ref 12.0–15.0)
Hemoglobin: 11.9 g/dL — ABNORMAL LOW (ref 12.0–15.0)
MCH: 30.8 pg (ref 26.0–34.0)
MCH: 30.4 pg (ref 26.0–34.0)
MCHC: 34.5 g/dL (ref 30.0–36.0)
MCHC: 34.3 g/dL (ref 30.0–36.0)
MCV: 89.3 fL (ref 78.0–100.0)
MCV: 88.7 fL (ref 78.0–100.0)
PLATELETS: 216 10*3/uL (ref 150–400)
Platelets: 207 10*3/uL (ref 150–400)
RBC: 4 MIL/uL (ref 3.87–5.11)
RBC: 3.91 MIL/uL (ref 3.87–5.11)
RDW: 13.1 % (ref 11.5–15.5)
RDW: 13.1 % (ref 11.5–15.5)
WBC: 14.7 10*3/uL — ABNORMAL HIGH (ref 4.0–10.5)
WBC: 10 10*3/uL (ref 4.0–10.5)

## 2016-02-18 LAB — HIV ANTIBODY (ROUTINE TESTING W REFLEX): HIV Screen 4th Generation wRfx: NONREACTIVE

## 2016-02-18 LAB — CBC WITH DIFFERENTIAL/PLATELET
BASOS ABS: 0 10*3/uL (ref 0.0–0.1)
BASOS PCT: 0 %
EOS ABS: 0.1 10*3/uL (ref 0.0–0.7)
Eosinophils Relative: 1 %
HCT: 36.8 % (ref 36.0–46.0)
HEMOGLOBIN: 12.5 g/dL (ref 12.0–15.0)
Lymphocytes Relative: 19 %
Lymphs Abs: 2.3 10*3/uL (ref 0.7–4.0)
MCH: 30.4 pg (ref 26.0–34.0)
MCHC: 34 g/dL (ref 30.0–36.0)
MCV: 89.5 fL (ref 78.0–100.0)
MONOS PCT: 6 %
Monocytes Absolute: 0.7 10*3/uL (ref 0.1–1.0)
NEUTROS PCT: 74 %
Neutro Abs: 9.2 10*3/uL — ABNORMAL HIGH (ref 1.7–7.7)
PLATELETS: 231 10*3/uL (ref 150–400)
RBC: 4.11 MIL/uL (ref 3.87–5.11)
RDW: 13.2 % (ref 11.5–15.5)
WBC: 12.3 10*3/uL — AB (ref 4.0–10.5)

## 2016-02-18 LAB — URINALYSIS, ROUTINE W REFLEX MICROSCOPIC
BILIRUBIN URINE: NEGATIVE
GLUCOSE, UA: NEGATIVE mg/dL
HGB URINE DIPSTICK: NEGATIVE
Ketones, ur: NEGATIVE mg/dL
Leukocytes, UA: NEGATIVE
Nitrite: NEGATIVE
PH: 5.5 (ref 5.0–8.0)
Protein, ur: NEGATIVE mg/dL

## 2016-02-18 LAB — POCT PREGNANCY, URINE: Preg Test, Ur: POSITIVE — AB

## 2016-02-18 LAB — WET PREP, GENITAL
Clue Cells Wet Prep HPF POC: NONE SEEN
SPERM: NONE SEEN
Trich, Wet Prep: NONE SEEN
Yeast Wet Prep HPF POC: NONE SEEN

## 2016-02-18 LAB — HCG, QUANTITATIVE, PREGNANCY: HCG, BETA CHAIN, QUANT, S: 20831 m[IU]/mL — AB (ref <5)

## 2016-02-18 SURGERY — LAPAROSCOPY, WITH ECTOPIC PREGNANCY SURGICAL TREATMENT
Anesthesia: Choice

## 2016-02-18 MED ORDER — FAMOTIDINE IN NACL 20-0.9 MG/50ML-% IV SOLN: 20.0000 mg | Freq: Once | INTRAVENOUS | Status: DC

## 2016-02-18 MED ORDER — SOD CITRATE-CITRIC ACID 500-334 MG/5ML PO SOLN: 30.0000 mL | Freq: Once | ORAL | Status: DC

## 2016-02-18 MED ORDER — SODIUM CHLORIDE 0.9% FLUSH: 3.0000 mL | Freq: Two times a day (BID) | INTRAVENOUS | Status: DC

## 2016-02-18 MED ORDER — HYDROMORPHONE HCL 1 MG/ML IJ SOLN: 0.2000 mg | INTRAMUSCULAR | Status: DC | PRN

## 2016-02-18 MED ORDER — FENTANYL CITRATE (PF) 100 MCG/2ML IJ SOLN
INTRAMUSCULAR | Status: AC
  Filled 2016-02-18: qty 2

## 2016-02-18 MED ORDER — PRENATAL MULTIVITAMIN CH
1.0000 | ORAL_TABLET | Freq: Every day | ORAL | Status: DC
  Administered 2016-02-18: 1 via ORAL
  Filled 2016-02-18: qty 1

## 2016-02-18 MED ORDER — LACTATED RINGERS IV SOLN
INTRAVENOUS | Status: DC
  Administered 2016-02-18: 07:00:00 via INTRAVENOUS

## 2016-02-18 MED ORDER — SODIUM CHLORIDE 0.9% FLUSH: 3.0000 mL | INTRAVENOUS | Status: DC | PRN

## 2016-02-18 MED ORDER — PRENATAL MULTIVITAMIN CH: 1.0000 | ORAL_TABLET | Freq: Every day | ORAL | Status: DC

## 2016-02-18 MED ORDER — FENTANYL CITRATE (PF) 100 MCG/2ML IJ SOLN
50.0000 ug | Freq: Once | INTRAMUSCULAR | Status: AC
  Administered 2016-02-18: 50 ug via INTRAVENOUS

## 2016-02-18 MED ORDER — SODIUM CHLORIDE 0.9 % IV SOLN
250.0000 mL | INTRAVENOUS | Status: DC | PRN
  Administered 2016-02-18: 250 mL via INTRAVENOUS

## 2016-02-18 NOTE — Progress Notes (Signed)
Patient ID: Laurie LewandowskyJessica Wood, female   DOB: 08/09/1985, 30 y.o.   MRN: [id number] Subjective: NO PAIN. Mild cramps and may be RLQ slight pain if she tried to focus. No vaginal bleeding. Ambulating, voiding. No dizziness.   Objective: Vital signs in last 24 hours: Orthostatics negative Temp:  [97.5 F (36.4 C)-98.3 F (36.8 C)] 98.3 F (36.8 C) (07/16 1145) Pulse Rate:  [67-72] 67 (07/16 1145) Resp:  [12-18] 12 (07/16 1145) BP: (102-114)/(50-60) 103/60 mmHg (07/16 1145) SpO2:  [100 %] 100 % (07/16 1145) Weight:  [126 lb (57.153 kg)-126 lb 9.6 oz (57.425 kg)] 126 lb (57.153 kg) (07/16 0730) Weight change:  Last BM Date: 02/18/16  General appearance: alert and cooperative Resp: clear to auscultation bilaterally Cardio: regular rate and rhythm, S1, S2 normal, no murmur, click, rub or gallop GI: normal findings: bowel sounds normal, soft, non-tender, symmetric and no rebound/guarding Extremities: extremities normal, atraumatic, no cyanosis or edema Pulses: 2+ and symmetric  Pelvic: Anteverted uterus, no CMT or adnexal tenderness  Lab Results:  Recent Labs  02/18/16 0437 02/18/16 1041  WBC 14.7* 10.0  HGB 12.3 11.9*  HCT 35.7* 34.7*  PLT 207 216    Medications: None  Assessment/Plan: 6 wks intrauterine pregnancy with subchorionic hemorrhage and acute RLQ pain with right adnexal mass and pelvic complex fluid likely hemorrhage, most likely from hemorrhagic corpus luteum and extremely unlikely ectopic pregnancy. Patient's pain has resolved, exam is normal and H/H is stable.  Will give regular diet and discharge home, precautions and warning s/s incl pain returning, dizziness, shoulder pain, SOB etc reviewed. [facility]  F/up in 48 hrs in office, will schedule sono as well with Dr Billy Coastaavon.    [name] 02/18/2016, 12:47 PM

## 2016-02-18 NOTE — H&P (Signed)
[name] is a 30 y.o. female presenting for sudden acute RLQ pain this morning. Pt is G3P2002, [redacted] wks pregnant from LMP, planned spontaneous pregnancy. Prior 2 spontaneous pregnancies, vag deliveries, last is 30 yo. No h/o PID. No fertility medication use.  Pt called after-hrs number with c/o cramping on 7/15. She noted slight bleeding/spotting on 7/15 evening. She went to bed with mild cramps but not in any acute pain and woke up early morning with acute RLQ pain. She has some nausea but no vomiting, does not think nausea is from pain as it was present yesterday as well. She denies fever/ chills. She denies dizziness/ palpitations/ SOB/ upper abdomen or shoulder referred pain. Her pain has improved since its onset and has not received analgesic. Discharged this afternoon and returned this evening. The patient's primary symptoms include pelvic pain. This is a new but recurrent  problem. The current episode started today (1800 pain returned). The problem occurs constantly. The problem has been gradually worsening. Pain severity upon presentation 7/10 now 2/10 after IV Fentanyl. The problem affects the right side. She is pregnant. Associated symptoms include abdominal pain and nausea. Pertinent negatives include no chills, constipation, diarrhea, dysuria, fever, frequency, urgency or vomiting. Nothing aggravates the symptoms. She has tried nothing for the symptoms. Pain now improved after Fentanyl. CBC and sono are stable.    History OB History    Gravida Para Term Preterm AB TAB SAB Ectopic Multiple Living   3 2 2       2      Past Medical History  Diagnosis Date  . Medical history non-contributory   . RLQ abdominal pain 02/18/2016  . Adnexal mass 02/18/2016    Suspect hemorrhage in adnexa from right corpus luteum   Past Surgical History  Procedure Laterality Date  . Anterior cruciate ligament repair     Family History: family history includes Cancer in her paternal grandfather; Heart attack  in her paternal grandfather; Hypertension in her mother. Social History:  reports that she has never smoked. She has never used smokeless tobacco. She reports that she does not drink alcohol or use illicit drugs.  ROS above  Exam Physical Exam  BP 106/55 mmHg  Pulse 76  Temp(Src) 98.5 F (36.9 C) (Oral)  Resp 16  SpO2 100%  LMP 01/03/2016   A&O x 3, no acute distress. Pleasant HEENT neg, no thyromegaly Lungs CTA bilat CV RRR, S1S2 normal Abdo: Soft, very slightly tender in RLQ with no rebound or guarding. No distention. Non acute. Active BS. Can extend right leg without referred pain and no pain reported when bed shook or during transvaginal sonogram Extr no edema/ tenderness Pelvic deferring since I am suspected organized hemorrhage in right adnexa for possibly hemorrhagic corpus luteum  Prenatal labs: ABO, Rh: --/--/O POS (07/16 1948) Antibody: NEG (07/16 1948) CBC    Component Value Date/Time   WBC 12.3* 02/18/2016 1948   RBC 4.11 02/18/2016 1948   HGB 12.5 02/18/2016 1948   HCT 36.8 02/18/2016 1948   PLT 231 02/18/2016 1948   MCV 89.5 02/18/2016 1948   MCH 30.4 02/18/2016 1948   MCHC 34.0 02/18/2016 1948   RDW 13.2 02/18/2016 1948   LYMPHSABS 2.3 02/18/2016 1948   MONOABS 0.7 02/18/2016 1948   EOSABS 0.1 02/18/2016 1948   BASOSABS 0.0 02/18/2016 1948   Pelvic sono report and images reviewed:  Early IUP noted, gestational.sac 5.5 wks, Yolk sac noted, small fetal pole, cardiac activity not seen yet. Subchorionic hemorrhage 4 x  2.8 cm.  The ovaries appear normal. In the right adnexa, there is a heterogeneous mostly hyperechoic mass measuring 3 x 2.4 x 2.4 cm. Peripheral flow is demonstrated on color flow. Doppler imaging. Small central cystic collection. Appearance is worrisome for ectopic pregnancy. No definitive yolk sac or fetal pole was identified. Moderate amount of free fluid was demonstrated in the pelvis. Fluid contains internal echoes, possibly hemorrhagic.  Suspect heterotopic ectopic pregnancy. FU sono tonight with persistent but not enlarging mass.   Assessment/Plan: 30 yo, G3P2002, 6 wks pregnancy with early Intrauterine pregnancy 5.5 wks with YS, large [facility] and right adnexal 3x2.4 cm complex mass and complex pelvic fluid consistent with hemorrhage. Complex adnexal mass has peripheral flow and is separate from the ovary per radiology hence ectopic pregnancy is suspected. Stable sono and stable CBC tonight.  Patient is stable, H/H stable, VS stable with nonsurgical abdomen noted despite recurrent episode of pain tonight.  Differential  Diagnoses- Hemorrhage from corpus luteum with organized mass vs tubal ectopic abortion vs tubal pregnancy.   Plan to admit for observation and assess serially, Observation vs  option to proceed with diagnostic laparoscopy. Risks/ benefits reviewed. Patient is low risk for anesthesia and surgery but is also low risk for hetertopic pregnancy. Surgery and General anesthesia not without risk.  After counseling, we have planned to observe and reassess if surgery needed.   Patient and her husband voiced understanding.   Bristyl Mclees J 02/18/2016, 10:20 PM

## 2016-02-18 NOTE — MAU Note (Signed)
Pt was discharged earlier today and returns with severe onset right sided pain.

## 2016-02-18 NOTE — H&P (Signed)
[name] is a 30 y.o. female presenting for sudden acute RLQ pain this morning. Pt is G3P2002, [redacted] wks pregnant from LMP, planned spontaneous pregnancy. Prior 2 spontaneous pregnancies, vag deliveries, last is 30 yo. No h/o PID. No fertility medication use.  Pt called after-hrs number with c/o cramping on 7/15. She noted slight bleeding/spotting on 7/15 evening. She went to bed with mild cramps but not in any acute pain and woke up early morning with acute RLQ pain. She has some nausea but no vomiting, does not think nausea is from pain as it was present yesterday as well. She denies fever/ chills. She denies dizziness/ palpitations/ SOB/ upper abdomen or shoulder referred pain. Her pain has improved since its onset and has not received analgesic/   History OB History    Gravida Para Term Preterm AB TAB SAB Ectopic Multiple Living   Past Medical History  Diagnosis Date  . Medical history non-contributory   . RLQ abdominal pain 02/18/2016  . Adnexal mass 02/18/2016    Suspect hemorrhage in adnexa from right corpus luteum   Past Surgical History  Procedure Laterality Date  . Anterior cruciate ligament repair     Family History: family history includes Cancer in her paternal grandfather; Heart attack in her paternal grandfather; Hypertension in her mother. Social History:  reports that she has never smoked. She has never used smokeless tobacco. She reports that she does not drink alcohol or use illicit drugs.  ROS above  Exam Physical Exam  BP 114/50 mmHg  Pulse 71  Temp(Src) 97.5 F (36.4 C)  Resp 18  Ht 5' (1.524 m)  Wt 126 lb 9.6 oz (57.425 kg)  BMI 24.72 kg/m2  LMP 01/03/2016   A&O x 3, no acute distress. Pleasant HEENT neg, no thyromegaly Lungs CTA bilat CV RRR, S1S2 normal Abdo: Soft, very slightly tender in RLQ with no rebound or guarding. No distention. Non acute. Active BS. Can extend right leg without referred pain and no pain reported when bed shook  or during transvaginal sonogram Extr no edema/ tenderness Pelvic deferring since I am suspected organized hemorrhage in right adnexa for possibly hemorrhagic corpus luteum  Prenatal labs: ABO, Rh: --/--/O POS (07/16 0604) Antibody: NEG (07/16 0604) CBC    Component Value Date/Time   WBC 14.7* 02/18/2016 0437   RBC 4.00 02/18/2016 0437   HGB 12.3 02/18/2016 0437   HCT 35.7* 02/18/2016 0437   PLT 207 02/18/2016 0437   MCV 89.3 02/18/2016 0437   MCH 30.8 02/18/2016 0437   MCHC 34.5 02/18/2016 0437   RDW 13.1 02/18/2016 0437   Pelvic sono report and images reviewed:  Early IUP noted, gestational.sac 5.5 wks, Yolk sac noted, small fetal pole, cardiac activity not seen yet. Subchorionic hemorrhage 4 x 2.8 cm.  The ovaries appear normal. In the right adnexa, there is a heterogeneous mostly hyperechoic mass measuring 3 x 2.4 x 2.4 cm. Peripheral flow is demonstrated on color flow. Doppler imaging. Small central cystic collection. Appearance is worrisome for ectopic pregnancy. No definitive yolk sac or fetal pole was identified. Moderate amount of free fluid was demonstrated in the pelvis. Fluid contains internal echoes, possibly hemorrhagic. Suspect heterotopic ectopic pregnancy.    Assessment/Plan: 30 yo, G3P2002, 6 wks pregnancy with early Intrauterine pregnancy 5.5 wks with YS and right adnexal 3x2.4 cm complex mass and complex pelvic fluid consistent with hemorrhage. Complex adnexal mass has peripheral flow and  is separate from the ovary per radiology hence ectopic pregnancy is suspected. Patient is stable, H/H stable, vital stable and clinically no peritoneal signs noted.  So possible diagnoses: Hemorrhage from corpus luteum with organized mass vs tubal ectopic abortion vs tubal pregnancy.  Plan to admit for observation and assess serially, CBC at 10 am v/s option to proceed with diagnostic laparoscopy. Risks/ benefits reviewed. Patient is low risk for anesthesia and surgery. After  counseling, we have planned to observe and reassess if surgery needed.  Patient and her husband voiced understanding.   Korine Winton R 02/18/2016, 7:05 AM

## 2016-02-18 NOTE — Progress Notes (Signed)
Discharge teaching complete. Pt ambulated out of the hospital and discharged home to family.  

## 2016-02-18 NOTE — MAU Note (Signed)
Pt seen earlier today , returns now with worsening pain . Continued vaginal bleeding.

## 2016-02-18 NOTE — MAU Note (Signed)
Off and on RLQ pain since Friday. Occ cramping. NO bleeding. LMP 5/31. Pos home upt but has not been to doctor yet

## 2016-02-18 NOTE — Discharge Summary (Signed)
Physician Discharge Summary  Patient ID: Laurie LewandowskyJessica Wood MRN: [id number] DOB/AGE: 31/11/1985 31 y.o.  Admit date: 02/18/2016 Discharge date: 02/18/2016  Admission Diagnoses: Right Lower Quadrant pain, complex adnexal mass and complex adnexal fluid, intrauterine pregnancy with subchorionic hemorrhage, suspicion for coexisting ectopic pregnancy  Discharge Diagnoses:  Principal Problem:   RLQ abdominal pain Active Problems:   Adnexal mass   Pelvic mass in female Stable patient with clinical diagnosis of hemorrhagic corpus luteal cyst with pain and bleeding causing complex mass  Discharged Condition: Pelvic pain resolved without any medication  Hospital Course: Patient presented with acute RLQ pain and slight tinge of vaginal bleeding.   Significant Diagnostic Studies: Pelvic Sonogram   Treatments: Observation  Discharge Exam: Blood pressure 103/60, pulse 67, temperature 98.3 F (36.8 C), temperature source Oral, resp. rate 12, height 5' (1.524 m), weight 126 lb (57.153 kg), last menstrual period 01/03/2016, SpO2 100 %, unknown if currently breastfeeding. General appearance: alert and cooperative Resp: clear to auscultation bilaterally Cardio: regular rate and rhythm, S1, S2 normal, no murmur, click, rub or gallop GI: normal findings: bowel sounds normal, soft, non-tender, symmetric and no rebound/guarding Extremities: extremities normal, atraumatic, no cyanosis or edema Pulses: 2+ and symmetric  Pelvic: Anteverted uterus, no CMT or adnexal tenderness  Disposition: 01-Home or Self Care  Discharge Instructions    Call MD for:  difficulty breathing, headache or visual disturbances    Complete by:  As directed      Call MD for:  persistant dizziness or light-headedness    Complete by:  As directed      Call MD for:  persistant nausea and vomiting    Complete by:  As directed      Call MD for:  severe uncontrolled pain    Complete by:  As directed      Call MD for:    Complete by:   As directed   Period like heavy vaginal bleeding     Diet - low sodium heart healthy    Complete by:  As directed      Discharge instructions    Complete by:  As directed   Pelvic rest, no lifting more than 25 pounds, no moderate to heavy exercise until further evaluation in office. Call back [facility] if pain returned or is worse     Driving Restrictions    Complete by:  As directed   Do not drive until you have further evaluation in office with ultrasound     Increase activity slowly    Complete by:  As directed      Lifting restrictions    Complete by:  As directed   25 pounds     Sexual Activity Restrictions    Complete by:  As directed   None until further evaluation            Medication List    TAKE these medications        acetaminophen 500 MG tablet  Commonly known as:  TYLENOL  Take 500 mg by mouth every 6 (six) hours as needed for mild pain or headache.     prenatal multivitamin Tabs tablet  Take 1 tablet by mouth at bedtime.         Signed: Brooklee Michelin R 02/18/2016, 12:56 PM

## 2016-02-18 NOTE — Progress Notes (Signed)
Patient ID: Laurie LewandowskyJessica Wood, female   DOB: 10/20/1985, 30 y.o.   MRN: [id number] VS stable BP 113/60 mmHg  Pulse 71  Temp(Src) 98 F (36.7 C) (Oral)  Resp 16  Ht 5' (1.524 m)  Wt 126 lb (57.153 kg)  BMI 24.61 kg/m2  SpO2 100%  LMP 01/03/2016 Orthostatics negative.

## 2016-02-18 NOTE — MAU Provider Note (Signed)
History     CSN: [id number]  Arrival date and time: 02/18/16 [id number]   First Provider Initiated Contact with Patient 02/18/16 1952      Chief Complaint  Patient presents with  . Pelvic Pain   Pelvic Pain The patient's primary symptoms include pelvic pain. This is a new problem. The current episode started today (1800 pain returned). The problem occurs constantly. The problem has been gradually worsening. Pain severity now: 7/10  The problem affects the right side. She is pregnant. Associated symptoms include abdominal pain and nausea. Pertinent negatives include no chills, constipation, diarrhea, dysuria, fever, frequency, urgency or vomiting. Nothing aggravates the symptoms. She has tried nothing for the symptoms.    Past Medical History  Diagnosis Date  . Medical history non-contributory   . RLQ abdominal pain 02/18/2016  . Adnexal mass 02/18/2016    Suspect hemorrhage in adnexa from right corpus luteum    Past Surgical History  Procedure Laterality Date  . Anterior cruciate ligament repair      Family History  Problem Relation Age of Onset  . Hypertension Mother   . Heart attack Paternal Grandfather   . Cancer Paternal Grandfather     prostate    Social History  Substance Use Topics  . Smoking status: Never Smoker   . Smokeless tobacco: Never Used  . Alcohol Use: No    Allergies: No Known Allergies  Prescriptions prior to admission  Medication Sig Dispense Refill Last Dose  . acetaminophen (TYLENOL) 500 MG tablet Take 500 mg by mouth every 6 (six) hours as needed for mild pain or headache.   02/17/2016 at 1100  . Prenatal Vit-Fe Fumarate-FA (PRENATAL MULTIVITAMIN) TABS tablet Take 1 tablet by mouth at bedtime.    Past Week at Unknown time    Review of Systems  Constitutional: Negative for fever and chills.  Gastrointestinal: Positive for nausea and abdominal pain. Negative for vomiting, diarrhea and constipation.  Genitourinary: Positive for pelvic pain. Negative  for dysuria, urgency and frequency.   Physical Exam   Blood pressure 97/53, pulse 81, temperature 98.5 F (36.9 C), temperature source Oral, resp. rate 16, last menstrual period 01/03/2016, SpO2 100 %, unknown if currently breastfeeding.  Physical Exam  Nursing note and vitals reviewed. Constitutional: She is oriented to person, place, and time. She appears well-developed and well-nourished. She appears distressed.  Cardiovascular: Normal rate.   Respiratory: Effort normal.  GI: Soft. There is no tenderness. There is no rebound and no guarding.  Neurological: She is alert and oriented to person, place, and time.  Skin: Skin is warm and dry.  Psychiatric: She has a normal mood and affect.    MAU Course  Procedures  MDM 1955: D/W Dr Billy Coastaavon, will get CBC and pelvic US. He will likely keep her on the 3rd floor for obs.  2000 [facility] Tawnya CrookHogan, Heather Donovan  8:11 PM 02/18/2016   Ultrasound: IMPRESSION: Single intrauterine gestational again identified with large subchorionic hemorrhage.  Complex hypoechoic structure in the right adnexal region again noted with slightly increasing complex free pelvic fluid -suspicious for heterotopic/ concurrent ectopic pregnancy.  Results for orders placed or performed during the hospital encounter of 02/18/16 (from the past 24 hour(s))  CBC with Differential     Status: Abnormal   Collection Time: 02/18/16  7:48 PM  Result Value Ref Range   WBC 12.3 (H) 4.0 - 10.5 K/uL   RBC 4.11 3.87 - 5.11 MIL/uL   Hemoglobin 12.5 12.0 -  15.0 g/dL   HCT 16.1 09.6 - 04.5 %   MCV 89.5 78.0 - 100.0 fL   MCH 30.4 26.0 - 34.0 pg   MCHC 34.0 30.0 - 36.0 g/dL   RDW 40.9 81.1 - 91.4 %   Platelets 231 150 - 400 K/uL   Neutrophils Relative % 74 %   Neutro Abs 9.2 (H) 1.7 - 7.7 K/uL   Lymphocytes Relative 19 %   Lymphs Abs 2.3 0.7 - 4.0 K/uL   Monocytes Relative 6 %   Monocytes Absolute 0.7 0.1 - 1.0 K/uL   Eosinophils Relative  1 %   Eosinophils Absolute 0.1 0.0 - 0.7 K/uL   Basophils Relative 0 %   Basophils Absolute 0.0 0.0 - 0.1 K/uL  Type and screen [facility]     Status: None (Preliminary result)   Collection Time: 02/18/16  7:48 PM  Result Value Ref Range   ABO/RH(D) O POS    Antibody Screen PENDING    Sample Expiration 02/21/2016    2100 Pt reassessed; reports pain now a 2/10.  Dr. Billy Coast notified and reviewed ultrasound results, labs, and current pain level > admit to 3rd floor for observation (pain meds as needed) Assessment and Plan  Pelvic Pain - ? heterotopic/ concurrent ectopic pregnancy.  Plan: Admit to [facility]'s Unit Activity as tolerated Diet as tolerated IV Dilaudid prn  Marlis Edelson, CNM

## 2016-02-18 NOTE — MAU Provider Note (Signed)
History     CSN: [id number]  Arrival date and time: 02/18/16 1308   First Provider Initiated Contact with Patient 02/18/16 0423      Chief Complaint  Patient presents with  . Abdominal Pain   HPI   Ms.[name] is a 30 y.o. female G3P2002 @ [redacted]w[redacted]d here with right sided abdominal pain that started on Friday.  She feels that the pain has waxed and waned. The pain progressed and had gotten worse over the weekend and then stopped yesterday. She woke up this morning @ 0200 and the pain had returned. She currently rates her pain 3/10, the pain is in her RLQ.  She has not taken anything for the pain.  The patient is guarding her right side at this time.   She went to the bathroom while here in MAU and noted scant blood on the tissue.   She see's Dr. Billy Wood @ [facility].   OB History    Gravida Para Term Preterm AB TAB SAB Ectopic Multiple Living   3 2 2       2       Past Medical History  Diagnosis Date  . Medical history non-contributory     Past Surgical History  Procedure Laterality Date  . Anterior cruciate ligament repair      Family History  Problem Relation Age of Onset  . Hypertension Mother   . Heart attack Paternal Grandfather   . Cancer Paternal Grandfather     prostate    Social History  Substance Use Topics  . Smoking status: Never Smoker   . Smokeless tobacco: Never Used  . Alcohol Use: No    Allergies: No Known Allergies  Prescriptions prior to admission  Medication Sig Dispense Refill Last Dose  . calcium carbonate (TUMS - DOSED IN MG ELEMENTAL CALCIUM) 500 MG chewable tablet Chew 1 tablet by mouth as needed for indigestion or heartburn.   12/01/2013 at Unknown time  . ibuprofen (ADVIL,MOTRIN) 600 MG tablet Take 1 tablet (600 mg total) by mouth every 6 (six) hours. 30 tablet 1   . oxyCODONE-acetaminophen (PERCOCET/ROXICET) 5-325 MG per tablet Take 1-2 tablets by mouth every 4 (four) hours as needed for severe pain (moderate - severe pain). 30  tablet 0   . polyethylene glycol (MIRALAX / GLYCOLAX) packet Take 17 g by mouth every other day.   12/01/2013 at Unknown time  . Prenatal Vit-Fe Fumarate-FA (PRENATAL MULTIVITAMIN) TABS tablet Take 1 tablet by mouth daily at 12 noon.   2 days   Results for orders placed or performed during the hospital encounter of 02/18/16 (from the past 48 hour(s))  Urinalysis, Routine w reflex microscopic (not at [facility])     Status: Abnormal   Collection Time: 02/18/16  4:00 AM  Result Value Ref Range   Color, Urine YELLOW YELLOW   APPearance CLEAR CLEAR   Specific Gravity, Urine >1.030 (H) 1.005 - 1.030   pH 5.5 5.0 - 8.0   Glucose, UA NEGATIVE NEGATIVE mg/dL   Hgb urine dipstick NEGATIVE NEGATIVE   Bilirubin Urine NEGATIVE NEGATIVE   Ketones, ur NEGATIVE NEGATIVE mg/dL   Protein, ur NEGATIVE NEGATIVE mg/dL   Nitrite NEGATIVE NEGATIVE   Leukocytes, UA NEGATIVE NEGATIVE    Comment: MICROSCOPIC NOT DONE ON URINES WITH NEGATIVE PROTEIN, BLOOD, LEUKOCYTES, NITRITE, OR GLUCOSE <1000 mg/dL.  Pregnancy, urine POC     Status: Abnormal   Collection Time: 02/18/16  4:10 AM  Result Value Ref Range   Preg Test, Ur POSITIVE (  A) NEGATIVE    Comment:        THE SENSITIVITY OF THIS METHODOLOGY IS >24 mIU/mL   Wet prep, genital     Status: Abnormal   Collection Time: 02/18/16  4:31 AM  Result Value Ref Range   Yeast Wet Prep HPF POC NONE SEEN NONE SEEN   Trich, Wet Prep NONE SEEN NONE SEEN   Clue Cells Wet Prep HPF POC NONE SEEN NONE SEEN   WBC, Wet Prep HPF POC MANY (A) NONE SEEN    Comment: FEW BACTERIA SEEN   Sperm NONE SEEN   CBC     Status: Abnormal   Collection Time: 02/18/16  4:37 AM  Result Value Ref Range   WBC 14.7 (H) 4.0 - 10.5 K/uL   RBC 4.00 3.87 - 5.11 MIL/uL   Hemoglobin 12.3 12.0 - 15.0 g/dL   HCT 16.1 (L) 09.6 - 04.5 %   MCV 89.3 78.0 - 100.0 fL   MCH 30.8 26.0 - 34.0 pg   MCHC 34.5 30.0 - 36.0 g/dL   RDW 40.9 81.1 - 91.4 %   Platelets 207 150 - 400 K/uL  hCG, quantitative,  pregnancy     Status: Abnormal   Collection Time: 02/18/16  4:37 AM  Result Value Ref Range   hCG, Beta Chain, Quant, S 20831 (H) <5 mIU/mL    Comment:          GEST. AGE      CONC.  (mIU/mL)   <=1 WEEK        5 - 50     2 WEEKS       50 - 500     3 WEEKS       100 - 10,000     4 WEEKS     1,000 - 30,000     5 WEEKS     3,500 - 115,000   6-8 WEEKS     12,000 - 270,000    12 WEEKS     15,000 - 220,000        FEMALE AND NON-PREGNANT FEMALE:     LESS THAN 5 mIU/mL     Review of Systems  Constitutional: Negative for fever and chills.  Gastrointestinal: Positive for nausea, abdominal pain and diarrhea.   Physical Exam   Blood pressure 114/50, pulse 71, temperature 97.5 F (36.4 C), resp. rate 18, height 5' (1.524 m), weight 126 lb 9.6 oz (57.425 kg), last menstrual period 01/03/2016, unknown if currently breastfeeding.  Physical Exam  Constitutional: She is oriented to person, place, and time. She appears well-developed and well-nourished.  HENT:  Head: Normocephalic.  Eyes: Pupils are equal, round, and reactive to light.  GI: Soft. Normal appearance. There is tenderness in the right lower quadrant, periumbilical area and suprapubic area. There is rigidity and guarding. There is no rebound.  Genitourinary:  Speculum exam: Vagina - Small amount of pink discharge noted,  no odor Cervix - No contact bleeding, no active bleeding  Bimanual exam: Cervix closed Uterus non tender, normal size Right adnexal tenderness with suprapubic tenderness  Wet prep done Chaperone present for exam.  Musculoskeletal: Normal range of motion.  Neurological: She is alert and oriented to person, place, and time.  Skin: Skin is warm. She is not diaphoretic.  Psychiatric: Her behavior is normal.    MAU Course  Procedures  None  MDM  O positive blood type   0545: Discussed [place] with Radiologist [phone]: [name] called and asked to come into  MAU. Type and screen ordered, IV established NPO>  patient last ate at @ 2100 yesterday.   Dr. Juliene PinaMody at bedside and resumes care of the patient.   Assessment and Plan   Laurie LopeJennifer I Rasch, NP 02/18/2016 6:44 AM

## 2016-02-19 MED ORDER — OXYCODONE-ACETAMINOPHEN 5-325 MG PO TABS: 1.0000 | ORAL_TABLET | ORAL | Status: DC | PRN

## 2016-02-19 NOTE — Plan of Care (Signed)
Problem: Pain Managment: Goal: General experience of comfort will improve Outcome: Completed/Met Date Met:  02/19/16 Good pain control with IV Dilaudid.  Problem: Tissue Perfusion: Goal: Risk factors for ineffective tissue perfusion will decrease Outcome: Completed/Met Date Met:  02/19/16 VSS   Problem: Fluid Volume: Goal: Ability to maintain a balanced intake and output will improve Outcome: Completed/Met Date Met:  02/19/16 Good appetite no nausea or vomiting.Voids qs clear yellow urine.

## 2016-02-19 NOTE — Progress Notes (Signed)
Pt discharged to home with friend.  Condition stable.  Pt ambulated to car with Dolphus Jenny. Riley, NT.  No equipment for home ordered at discharge.

## 2016-02-19 NOTE — Progress Notes (Signed)
Subjective: Patient reports tolerating PO, + flatus and no problems voiding.   No further pain. Received Fentanyl x one last night at 1900 with no subsequent pain.  No nausea or vomiting.  Objective: Blood pressure 97/56, pulse 69, temperature 98.2 F (36.8 C), temperature source Oral, resp. rate 16, height 5' (1.524 m), weight 56.813 kg (125 lb 4 oz), last menstrual period 01/03/2016, SpO2 100 %, unknown if currently breastfeeding.  CBC    Component Value Date/Time   WBC 12.3* 02/18/2016 1948   RBC 4.11 02/18/2016 1948   HGB 12.5 02/18/2016 1948   HCT 36.8 02/18/2016 1948   PLT 231 02/18/2016 1948   MCV 89.5 02/18/2016 1948   MCH 30.4 02/18/2016 1948   MCHC 34.0 02/18/2016 1948   RDW 13.2 02/18/2016 1948   LYMPHSABS 2.3 02/18/2016 1948   MONOABS 0.7 02/18/2016 1948   EOSABS 0.1 02/18/2016 1948   BASOSABS 0.0 02/18/2016 1948    I have reviewed patient's vital signs, intake and output, medications and labs.  General: alert, cooperative and appears stated age Resp: clear to auscultation bilaterally and normal percussion bilaterally Cardio: regular rate and rhythm, S1, S2 normal, no murmur, click, rub or gallop GI: soft, non-tender; bowel sounds normal; no masses,  no organomegaly and normal findings: bowel sounds normal, soft, non-tender and symmetric Extremities: extremities normal, atraumatic, no cyanosis or edema and Homans sign is negative, no sign of DVT Vaginal Bleeding: none   Assessment/Plan: HD #1 s/p observation for RLQ pain . 3 day history of RLQ pain. Sono c/w IUP and ? Complex mass in right adnexa suspicious for ectopic vs hemorrhagic cyst.   3 episodes x 2d. No pain now. Stable CBC. Sono last night stable. Discussed with Dr. Ashley MurrainPoff (radiology). Imaging not definitive for ectopic. Results consistent with possible hemorrhagic cyst. No risk factors for ectopic. No ART. No history of PID or abdominal surgery. Clinically stable.  Pt desires DC at this time. Radiology  recommends rpt imaging in 24hr. Will arrange outpt sono tomorrow.. Roxicet #20 given. Pain Precautions and strict ectopic precautions.. Pt declines inpt observation at this time. DC teaching done.     [name] J 02/19/2016, 1:09 PM

## 2016-02-20 ENCOUNTER — Ambulatory Visit (HOSPITAL_COMMUNITY): Payer: BLUE CROSS/BLUE SHIELD | Admitting: Anesthesiology

## 2016-02-20 ENCOUNTER — Other Ambulatory Visit: Payer: Self-pay | Admitting: Obstetrics and Gynecology

## 2016-02-20 ENCOUNTER — Encounter (HOSPITAL_COMMUNITY): Payer: Self-pay

## 2016-02-20 ENCOUNTER — Encounter (HOSPITAL_COMMUNITY)
Admission: AD | Disposition: A | Discharge status: Home / Self Care | Payer: Self-pay | Source: Ambulatory Visit | Attending: Obstetrics and Gynecology

## 2016-02-20 ENCOUNTER — Ambulatory Visit (HOSPITAL_COMMUNITY)
Admission: AD | Admit: 2016-02-20 | Discharge: 2016-02-20 | Disposition: A | Discharge status: Home / Self Care | Payer: BLUE CROSS/BLUE SHIELD | Source: Ambulatory Visit | Attending: Obstetrics and Gynecology | Admitting: Obstetrics and Gynecology

## 2016-02-20 DIAGNOSIS — O008 Other ectopic pregnancy without intrauterine pregnancy: Secondary | ICD-10-CM | POA: Diagnosis not present

## 2016-02-20 DIAGNOSIS — R1031 Right lower quadrant pain: Secondary | ICD-10-CM | POA: Diagnosis present

## 2016-02-20 HISTORY — PX: LAPAROSCOPY: SHX197

## 2016-02-20 LAB — TYPE AND SCREEN
ABO/RH(D): O POS
ABO/RH(D): O POS
ABO/RH(D): O POS
ANTIBODY SCREEN: NEGATIVE
ANTIBODY SCREEN: NEGATIVE
Antibody Screen: NEGATIVE

## 2016-02-20 SURGERY — LAPAROSCOPY OPERATIVE
Anesthesia: General | Site: Abdomen

## 2016-02-20 MED ORDER — CEFAZOLIN SODIUM-DEXTROSE 2-4 GM/100ML-% IV SOLN
2.0000 g | INTRAVENOUS | Status: AC
  Administered 2016-02-20: 2 g via INTRAVENOUS

## 2016-02-20 MED ORDER — OXYCODONE-ACETAMINOPHEN 5-325 MG PO TABS: 1.0000 | ORAL_TABLET | ORAL | Status: DC | PRN

## 2016-02-20 MED ORDER — DEXAMETHASONE SODIUM PHOSPHATE 4 MG/ML IJ SOLN
INTRAMUSCULAR | Status: AC
  Filled 2016-02-20: qty 1

## 2016-02-20 MED ORDER — SUCCINYLCHOLINE CHLORIDE 200 MG/10ML IV SOSY
PREFILLED_SYRINGE | INTRAVENOUS | Status: DC | PRN
  Administered 2016-02-20: 80 mg via INTRAVENOUS

## 2016-02-20 MED ORDER — MIDAZOLAM HCL 5 MG/5ML IJ SOLN
INTRAMUSCULAR | Status: DC | PRN
  Administered 2016-02-20: 2 mg via INTRAVENOUS

## 2016-02-20 MED ORDER — PROPOFOL 10 MG/ML IV BOLUS
INTRAVENOUS | Status: AC
  Filled 2016-02-20: qty 20

## 2016-02-20 MED ORDER — FENTANYL CITRATE (PF) 100 MCG/2ML IJ SOLN
INTRAMUSCULAR | Status: AC
  Filled 2016-02-20: qty 2

## 2016-02-20 MED ORDER — ONDANSETRON HCL 4 MG/2ML IJ SOLN
INTRAMUSCULAR | Status: AC
  Filled 2016-02-20: qty 2

## 2016-02-20 MED ORDER — SODIUM CHLORIDE 0.9 % IJ SOLN
INTRAMUSCULAR | Status: AC
  Filled 2016-02-20: qty 10

## 2016-02-20 MED ORDER — FENTANYL CITRATE (PF) 250 MCG/5ML IJ SOLN
INTRAMUSCULAR | Status: AC
  Filled 2016-02-20: qty 5

## 2016-02-20 MED ORDER — LACTATED RINGERS IV SOLN: INTRAVENOUS | Status: DC

## 2016-02-20 MED ORDER — DEXAMETHASONE SODIUM PHOSPHATE 4 MG/ML IJ SOLN
INTRAMUSCULAR | Status: DC | PRN
  Administered 2016-02-20 (×2): 4 mg via INTRAVENOUS

## 2016-02-20 MED ORDER — FENTANYL CITRATE (PF) 100 MCG/2ML IJ SOLN
25.0000 ug | INTRAMUSCULAR | Status: DC | PRN
  Administered 2016-02-20 (×3): 25 ug via INTRAVENOUS

## 2016-02-20 MED ORDER — HEPARIN SODIUM (PORCINE) 5000 UNIT/ML IJ SOLN
INTRAMUSCULAR | Status: AC
  Filled 2016-02-20: qty 1

## 2016-02-20 MED ORDER — LIDOCAINE HCL (CARDIAC) 20 MG/ML IV SOLN
INTRAVENOUS | Status: AC
  Filled 2016-02-20: qty 5

## 2016-02-20 MED ORDER — BUPIVACAINE HCL (PF) 0.25 % IJ SOLN
INTRAMUSCULAR | Status: DC | PRN
  Administered 2016-02-20: 20 mL

## 2016-02-20 MED ORDER — PROPOFOL 10 MG/ML IV BOLUS
INTRAVENOUS | Status: DC | PRN
  Administered 2016-02-20: 200 mg via INTRAVENOUS

## 2016-02-20 MED ORDER — SUCCINYLCHOLINE CHLORIDE 20 MG/ML IJ SOLN
INTRAMUSCULAR | Status: AC
  Filled 2016-02-20: qty 1

## 2016-02-20 MED ORDER — ROCURONIUM BROMIDE 100 MG/10ML IV SOLN
INTRAVENOUS | Status: AC
  Filled 2016-02-20: qty 1

## 2016-02-20 MED ORDER — NEOSTIGMINE METHYLSULFATE 10 MG/10ML IV SOLN
INTRAVENOUS | Status: DC | PRN
  Administered 2016-02-20: 3.5 mg via INTRAVENOUS

## 2016-02-20 MED ORDER — ACETAMINOPHEN 160 MG/5ML PO SOLN: 975.0000 mg | Freq: Once | ORAL | Status: DC

## 2016-02-20 MED ORDER — LACTATED RINGERS IV SOLN
INTRAVENOUS | Status: DC
  Administered 2016-02-20 (×3): via INTRAVENOUS

## 2016-02-20 MED ORDER — FENTANYL CITRATE (PF) 100 MCG/2ML IJ SOLN
INTRAMUSCULAR | Status: DC | PRN
  Administered 2016-02-20: 100 ug via INTRAVENOUS
  Administered 2016-02-20: 50 ug via INTRAVENOUS
  Administered 2016-02-20 (×2): 25 ug via INTRAVENOUS
  Administered 2016-02-20: 50 ug via INTRAVENOUS

## 2016-02-20 MED ORDER — MIDAZOLAM HCL 2 MG/2ML IJ SOLN
INTRAMUSCULAR | Status: AC
  Filled 2016-02-20: qty 2

## 2016-02-20 MED ORDER — GLYCOPYRROLATE 0.2 MG/ML IJ SOLN
INTRAMUSCULAR | Status: DC | PRN
  Administered 2016-02-20: 0.6 mg via INTRAVENOUS

## 2016-02-20 MED ORDER — LACTATED RINGERS IR SOLN
Status: DC | PRN
  Administered 2016-02-20: 3000 mL

## 2016-02-20 MED ORDER — BUPIVACAINE HCL (PF) 0.25 % IJ SOLN
INTRAMUSCULAR | Status: AC
  Filled 2016-02-20: qty 30

## 2016-02-20 MED ORDER — ROCURONIUM BROMIDE 100 MG/10ML IV SOLN
INTRAVENOUS | Status: DC | PRN
  Administered 2016-02-20: 30 mg via INTRAVENOUS

## 2016-02-20 MED ORDER — SODIUM CHLORIDE 0.9 % IJ SOLN
INTRAMUSCULAR | Status: DC | PRN
  Administered 2016-02-20: 10 mL

## 2016-02-20 MED ORDER — LIDOCAINE 2% (20 MG/ML) 5 ML SYRINGE
INTRAMUSCULAR | Status: DC | PRN
  Administered 2016-02-20: 50 mg via INTRAVENOUS

## 2016-02-20 SURGICAL SUPPLY — 27 items
CABLE HIGH FREQUENCY MONO STRZ (ELECTRODE) IMPLANT
CATH ROBINSON RED A/P 16FR (CATHETERS) IMPLANT
CLOTH BEACON ORANGE TIMEOUT ST (SAFETY) ×2 IMPLANT
DRSG COVADERM PLUS 2X2 (GAUZE/BANDAGES/DRESSINGS) IMPLANT
DRSG OPSITE POSTOP 3X4 (GAUZE/BANDAGES/DRESSINGS) ×2 IMPLANT
FORCEPS CUTTING 33CM 5MM (CUTTING FORCEPS) ×2 IMPLANT
FORCEPS CUTTING 45CM 5MM (CUTTING FORCEPS) IMPLANT
GLOVE BIO SURGEON STRL SZ7.5 (GLOVE) ×2 IMPLANT
GLOVE BIOGEL PI IND STRL 7.0 (GLOVE) ×1 IMPLANT
GLOVE BIOGEL PI INDICATOR 7.0 (GLOVE) ×1
GOWN STRL REUS W/TWL LRG LVL3 (GOWN DISPOSABLE) ×2 IMPLANT
LIQUID BAND (GAUZE/BANDAGES/DRESSINGS) ×2 IMPLANT
NEEDLE INSUFFLATION 120MM (ENDOMECHANICALS) ×2 IMPLANT
PACK LAPAROSCOPY BASIN (CUSTOM PROCEDURE TRAY) ×2 IMPLANT
PAD TRENDELENBURG POSITION (MISCELLANEOUS) ×2 IMPLANT
POUCH SPECIMEN RETRIEVAL 10MM (ENDOMECHANICALS) ×4 IMPLANT
SET IRRIG TUBING LAPAROSCOPIC (IRRIGATION / IRRIGATOR) ×2 IMPLANT
SLEEVE XCEL OPT CAN 5 100 (ENDOMECHANICALS) ×2 IMPLANT
SOLUTION ELECTROLUBE (MISCELLANEOUS) IMPLANT
SUT VICRYL 0 UR6 27IN ABS (SUTURE) ×2 IMPLANT
SUT VICRYL 4-0 PS2 18IN ABS (SUTURE) ×2 IMPLANT
TOWEL OR 17X24 6PK STRL BLUE (TOWEL DISPOSABLE) ×4 IMPLANT
TRAY FOLEY CATH SILVER 14FR (SET/KITS/TRAYS/PACK) ×2 IMPLANT
TROCAR OPTI TIP 5M 100M (ENDOMECHANICALS) ×2 IMPLANT
TROCAR XCEL DIL TIP R 11M (ENDOMECHANICALS) ×2 IMPLANT
WARMER LAPAROSCOPE (MISCELLANEOUS) ×2 IMPLANT
WATER STERILE IRR 1000ML POUR (IV SOLUTION) ×2 IMPLANT

## 2016-02-20 NOTE — H&P (Signed)
NAMGala [name]:  [name], [name]               ACCOUNT NO.:  [id number]  MEDICAL RECORD NO.:  [id number]  LOCATION:  [facility]                          FACILITY:  [facility]  PHYSICIAN:  Lenoard Adenichard J. Tristy Udovich, M.D.DATE OF BIRTH:  02/05/1986  DATE OF ADMISSION:  02/20/2016 DATE OF DISCHARGE:                             HISTORY & PHYSICAL   CHIEF COMPLAINT:  Right lower quadrant pain.  HISTORY OF PRESENT ILLNESS:  She is a 30 year old white female, G3, P2, 6 weeks LMP with an intrauterine pregnancy and right adnexal mass.  The patient has presented twice to the emergency room with episodes of right lower quadrant pain.  Two ultrasounds performed in the hospital revealing findings suggestive of possible right heterotopic pregnancy. The patient has no history of artificial reproductive technology to get pregnant.  She has no history of PID, no history of abdominal surgery. The patient presented twice, was discharged home after pain improved. Followup ultrasound done by Dr. April [name] in his office yesterday evening suggests evolution, possibly a process with more definitive hematosalpinx noted by ultrasound.  The patient continued to be asymptomatic, but presents now for diagnostic evaluation of possible right heterotopic pregnancy.  ALLERGIES:  She has no known drug allergies.  MEDICATIONS:  Prenatal vitamins.  PAST MEDICAL HISTORY:  She has a history of vaginal delivery x2.  No history of abdominal surgery.  FAMILY HISTORY:  Noncontributory.  PAST SURGICAL HISTORY:  Noncontributory.  PHYSICAL EXAMINATION:  GENERAL:  Well-developed, well-nourished white female, in no acute distress. HEENT:  Normal. NECK:  Supple.  Full range of motion. LUNGS:  Clear. HEART:  Regular rate and rhythm. ABDOMEN:  Soft, nontender.  Uterus 6 weeks size.  Right adnexal tenderness.  No rebound.  No guarding. EXTREMITIES:  There are no cords. NEUROLOGIC:  Nonfocal. SKIN:  Intact.  IMPRESSION:  Right lower quadrant pain  with presumed right heterotopic pregnancy.  PLAN:  Proceed with diagnostic laparoscopy, possible right salpingectomy, possible right salpingostomy.  Risks of anesthesia, infection, bleeding, injury to surrounding organs, possible need for repair were discussed.  Delayed versus immediate complications to include bowel and bladder injury noted.  Of note, it is noted that the intrauterine pregnancy is viable, however, a large subchorionic hemorrhage has been noted.  The possibility of poor outcome of the coexisting intrauterine pregnancy has been discussed.  The patient acknowledges the risks of surgery, general anesthesia, risks and benefits have been discussed.  Consent signed.  The patient acknowledges and wishes to proceed.     Lenoard Adenichard J. Amaziah Raisanen, M.D.     RJT/MEDQ  D:  02/20/2016  T:  02/20/2016  Job:  [id number]

## 2016-02-20 NOTE — Transfer of Care (Signed)
Immediate Anesthesia Transfer of Care Note  Patient: [name]  Procedure(s) Performed: Procedure(s): LAPAROSCOPY OPERATIVE, Right Salpingectomy with Removal Ectopic Pregnancy (N/A)  Patient Location: PACU  Anesthesia Type:General  Level of Consciousness: awake, alert , oriented and patient cooperative  Airway & Oxygen Therapy: Patient Spontanous Breathing and Patient connected to nasal cannula oxygen  Post-op Assessment: Report given to RN and Post -op Vital signs reviewed and stable  Post vital signs: Reviewed and stable  Last Vitals:  Filed Vitals:   02/20/16 1313  BP: 109/70  Pulse: 75  Temp: 36.9 C  Resp: 18    Last Pain: There were no vitals filed for this visit.       Complications: No apparent anesthesia complications

## 2016-02-20 NOTE — Progress Notes (Signed)
Patient ID: Laurie LewandowskyJessica Gentzler, female   DOB: 12/04/1985, 30 y.o.   MRN: [id number] Patient seen and examined. Consent witnessed and signed. No changes noted. Update completed.

## 2016-02-20 NOTE — H&P (Signed)
[name] is an 30 y.o. female for Diag LS, poss right heterotopic pregnancy  Pertinent Gynecological History: Menses: flow is moderate Bleeding: intermenstrual bleeding Contraception: none DES exposure: denies Blood transfusions: none Sexually transmitted diseases: no past history Previous GYN Procedures: na  Last mammogram: normal Date: na Last pap: normal Date: na OB History: G3, P2   Menstrual History: Menarche age: [age]  Patient's last menstrual period was 01/03/2016.    Past Medical History  Diagnosis Date  . Medical history non-contributory   . RLQ abdominal pain 02/18/2016  . Adnexal mass 02/18/2016    Suspect hemorrhage in adnexa from right corpus luteum    Past Surgical History  Procedure Laterality Date  . Anterior cruciate ligament repair      Family History  Problem Relation Age of Onset  . Hypertension Mother   . Heart attack Paternal Grandfather   . Cancer Paternal Grandfather     prostate    Social History:  reports that she has never smoked. She has never used smokeless tobacco. She reports that she does not drink alcohol or use illicit drugs.  Allergies: No Known Allergies  Prescriptions prior to admission  Medication Sig Dispense Refill Last Dose  . acetaminophen (TYLENOL) 500 MG tablet Take 500 mg by mouth every 6 (six) hours as needed for mild pain.   Past Week at Unknown time  . Prenatal Vit-Fe Fumarate-FA (PRENATAL MULTIVITAMIN) TABS tablet Take 1 tablet by mouth at bedtime.    02/19/2016 at Unknown time  . oxyCODONE-acetaminophen (ROXICET) 5-325 MG tablet Take 1-2 tablets by mouth every 4 (four) hours as needed for severe pain. (Patient not taking: Reported on 02/20/2016) 20 tablet 0     Review of Systems  Constitutional: Negative.   All other systems reviewed and are negative.   Blood pressure 109/70, pulse 75, temperature 98.5 F (36.9 C), temperature source Oral, resp. rate 18, last menstrual period 01/03/2016, SpO2 100 %, unknown if  currently breastfeeding. Physical Exam  Nursing note and vitals reviewed. Constitutional: She is oriented to person, place, and time. She appears well-developed and well-nourished.  HENT:  Head: Normocephalic and atraumatic.  Cardiovascular: Normal rate and regular rhythm.   Respiratory: Effort normal and breath sounds normal.  GI: Soft. Bowel sounds are normal.  Genitourinary: Vagina normal and uterus normal.  Neurological: She is alert and oriented to person, place, and time.  Skin: Skin is warm and dry.    No results found for this or any previous visit (from the past 24 hour(s)).  [place] Ob Transvaginal  02/18/2016  CLINICAL DATA:  30 year old pregnant female with right pelvic pain. Findings suspicious for heterotopic pregnancy on ultrasound earlier today. EXAM: TRANSVAGINAL OB ULTRASOUND TECHNIQUE: Transvaginal ultrasound was performed for complete evaluation of the gestation as well as the maternal uterus, adnexal regions, and pelvic cul-de-sac. COMPARISON:  02/18/2016 this morning. FINDINGS: Intrauterine gestational sac: Visualize Yolk sac:  Visualized Embryo:  Visualized Cardiac Activity: Possible CRL:   1.8  mm Subchorionic hemorrhage:  Large subchorionic hemorrhage. Maternal uterus/adnexae: A 2.7 x 1.8 x 1.9 cm complex hyperechoic structure in the right adnexal region is noted with slightly increasing complex free pelvic fluid -suspicious for ectopic pregnancy. No other changes noted. IMPRESSION: Single intrauterine gestational again identified with large subchorionic hemorrhage. Complex hypoechoic structure in the right adnexal region again noted with slightly increasing complex free pelvic fluid -suspicious for heterotopic/ concurrent ectopic pregnancy. Electronically Signed   By: Harmon PierJeffrey  Hu M.D.   On: 02/18/2016 20:51  Assessment/Plan: Right heterotopic pregnancy with intermittent RLQ pain Diag LS, poss right salpingectomy Consent done  Kelli Egolf J 02/20/2016, 1:22 PM

## 2016-02-20 NOTE — Anesthesia Preprocedure Evaluation (Signed)
Anesthesia Evaluation  Patient identified by MRN, date of birth, ID band Patient awake    Reviewed: Allergy & Precautions, H&P , Patient's Chart, lab work & pertinent test results, reviewed documented beta blocker date and time   Airway Mallampati: II  TM Distance: >3 FB Neck ROM: full    Dental no notable dental hx.    Pulmonary    Pulmonary exam normal breath sounds clear to auscultation       Cardiovascular  Rhythm:regular Rate:Normal     Neuro/Psych    GI/Hepatic   Endo/Other    Renal/GU      Musculoskeletal   Abdominal   Peds  Hematology   Anesthesia Other Findings   Reproductive/Obstetrics                             Anesthesia Physical Anesthesia Plan  ASA: II  Anesthesia Plan: General   Post-op Pain Management:    Induction: Intravenous  Airway Management Planned: Oral ETT  Additional Equipment:   Intra-op Plan:   Post-operative Plan: Extubation in OR  Informed Consent: I have reviewed the patients History and Physical, chart, labs and discussed the procedure including the risks, benefits and alternatives for the proposed anesthesia with the patient or authorized representative who has indicated his/her understanding and acceptance.   Dental Advisory Given and Dental advisory given  Plan Discussed with: CRNA and Surgeon  Anesthesia Plan Comments: (  Discussed general anesthesia, including possible nausea, instrumentation of airway, sore throat,pulmonary aspiration, etc. I asked if the were any outstanding questions, or  concerns before we proceeded.)        Anesthesia Quick Evaluation  

## 2016-02-20 NOTE — Op Note (Addendum)
02/20/2016  4:03 PM  PATIENT:  Laurie LewandowskyJessica [name]  30 y.o. female  PRE-OPERATIVE DIAGNOSIS:  RLQ pain, Right heterotopic pregnancy  POST-OPERATIVE DIAGNOSIS:  same  PROCEDURE:  Procedure(s): LAPAROSCOPY OPERATIVE, Right Salpingectomy with Removal Ectopic Pregnancy Evacuation of hemo peritoneum Lysis of right peritubal adhesions  SURGEON:  Surgeon(s): Olivia Mackieichard Davari Lopes, MD Genia DelMarie-Lyne Lavoie, MD  ASSISTANTS:  Seymour BarsLavoie, MD  ANESTHESIA:   local and general  ESTIMATED BLOOD LOSS: 50 cc hemoperitoneum , minmal loss with case  DRAINS: Urinary Catheter (Foley)   LOCAL MEDICATIONS USED:  MARCAINE    and Amount: 20 ml  SPECIMEN:  Source of Specimen:  right tube with blood and presumed ectopic  DISPOSITION OF SPECIMEN:  PATHOLOGY  COUNTS:  YES  DICTATION #: [id number]  PLAN OF CARE: dc home  PATIENT DISPOSITION:  PACU - hemodynamically stable.

## 2016-02-20 NOTE — Discharge Instructions (Addendum)

## 2016-02-20 NOTE — Anesthesia Postprocedure Evaluation (Signed)
Anesthesia Post Note  Patient: Laurie LewandowskyJessica Larocca  Procedure(s) Performed: Procedure(s) (LRB): LAPAROSCOPY OPERATIVE, Right Salpingectomy with Removal Ectopic Pregnancy (N/A)  Patient location during evaluation: PACU Anesthesia Type: General Level of consciousness: awake and alert Pain management: pain level controlled Vital Signs Assessment: post-procedure vital signs reviewed and stable Respiratory status: spontaneous breathing, nonlabored ventilation, respiratory function stable and patient connected to nasal cannula oxygen Cardiovascular status: blood pressure returned to baseline and stable Postop Assessment: no signs of nausea or vomiting Anesthetic complications: no     Last Vitals:  Filed Vitals:   02/20/16 1730 02/20/16 1815  BP: 98/61 98/55  Pulse: 69   Temp:  36.7 C  Resp: 14     Last Pain:  Filed Vitals:   02/20/16 1829  PainSc: 2    Pain Goal: Patients Stated Pain Goal: 3 (02/20/16 1815)               Phillips Groutarignan, Evianna Chandran

## 2016-02-20 NOTE — Anesthesia Procedure Notes (Signed)
Procedure Name: Intubation Date/Time: 02/20/2016 2:52 PM Performed by: Janeece AgeeWRAPE, Otniel Hoe W Pre-anesthesia Checklist: Patient identified, Emergency Drugs available, Patient being monitored, Timeout performed and Suction available Patient Re-evaluated:Patient Re-evaluated prior to inductionOxygen Delivery Method: Circle system utilized Preoxygenation: Pre-oxygenation with 100% oxygen Intubation Type: IV induction and Rapid sequence Laryngoscope Size: Mac and 3 Grade View: Grade II Tube type: Oral Number of attempts: 1 Airway Equipment and Method: Stylet Placement Confirmation: ETT inserted through vocal cords under direct vision,  positive ETCO2 and breath sounds checked- equal and bilateral Secured at: 21 cm Tube secured with: Tape Dental Injury: Teeth and Oropharynx as per pre-operative assessment

## 2016-02-21 NOTE — Op Note (Signed)
[name]:  Wood, Adisson               ACCOUNT NO.:  [id number]  MEDICAL RECORD NO.:  [id number]  LOCATION:  [facility]                          FACILITY:  [facility]  PHYSICIAN:  Lenoard Adenichard J. Banner Huckaba, M.D.DATE OF BIRTH:  Feb 02, 1986  DATE OF PROCEDURE: DATE OF DISCHARGE:  02/20/2016                              OPERATIVE REPORT   PREOPERATIVE DIAGNOSIS:  Right heterotopic pregnancy.  POSTOPERATIVE DIAGNOSIS:  Ruptured right heterotopic pregnancy.  PROCEDURE:  Diagnostic laparoscopy, evacuation of hemoperitoneum, right salpingectomy.  SURGEON:  Lenoard Adenichard J. Reshawn Ostlund, M.D.  ANESTHESIA:  General.  ESTIMATED BLOOD LOSS:  Less than 50 mL.  COUNTS:  Correct.  DISPOSITION:  The patient recovered in good condition.  SPECIMEN:  Right tube with presumed ectopic pregnancy and blood clot sent to Pathology.  BRIEF OPERATIVE NOTE:  After being apprised of risks of anesthesia, infection, bleeding, injury to surrounding organs, possible need for repair, delayed versus immediate complications to include bowel and bladder injury, possible need for repair, the patient was brought to the operating room where she was administered a general anesthetic without complications.  She was prepped and draped in usual sterile fashion.  A Foley catheter was placed.  Exam under anesthesia revealed a 6-8 week size uterus, and no adnexal masses were palpable.  At this time, an infraumbilical incision was made with a scalpel.  Veress needle was placed with opening pressure of -2; 3 liters of CO2 insufflated without difficulty.  Atraumatic trocar entry.  Pictures were taken revealing hemoperitoneum of approximately 50 mL to 100 mL of non-clotted blood in the anterior cul-de-sac.  Upon establishing deep Trendelenburg position, a large descended right tube is seen in the posterior cul-de-sac with adherent blood clot, and approximately 20 mL of clotted blood in the posterior cul-de-sac.  At this time, two 5 mm ports were made on  the left and right, and the uterus is gently removed forward using atraumatic blunt trocar.  The tube was elevated with lysis of adhesions out of the pelvis.  Uterus was then gently laid back into the pelvis. The gyrus device was entered through the 5-mm port, and the left mesosalpinx was cauterized using bipolar cautery, and the tube was detached.  The 5 mm scope was placed, and the EndoCatch was placed placing the specimen and adherent blood clot in addition to some separate blood clot into the EndoCatch which was then removed through the abdominal port.  Irrigation was accomplished.  Good hemostasis was noted along the right adnexa.  A normal left tube and ovary and a normal right ovary are noted and a normal anterior cul-de-sac.  The appendiceal area revealed some evidence of periappendiceal scarring.  There is a normal liver-gallbladder area, and a normal left paracolic gutters were noted. Irrigation further accomplished.  All instruments were removed under direct visualization.  CO2 released.  Incisions were closed using 0 Vicryl, 4-0 Vicryl, and Dermabond.  Foley catheter was removed.  The patient tolerated the procedure well.  The patient was awakened and transferred to Recovery in good condition.     Lenoard Adenichard J. Jinnifer Montejano, M.D.     RJT/MEDQ  D:  02/20/2016  T:  02/21/2016  Job:  [id number]

## 2016-02-23 ENCOUNTER — Encounter (HOSPITAL_COMMUNITY): Payer: Self-pay | Admitting: Obstetrics and Gynecology

## 2016-03-13 LAB — OB RESULTS CONSOLE ABO/RH: RH TYPE: POSITIVE

## 2016-03-13 LAB — OB RESULTS CONSOLE HEPATITIS B SURFACE ANTIGEN: Hepatitis B Surface Ag: NEGATIVE

## 2016-03-13 LAB — OB RESULTS CONSOLE RUBELLA ANTIBODY, IGM: Rubella: IMMUNE

## 2016-03-13 LAB — OB RESULTS CONSOLE GC/CHLAMYDIA
Chlamydia: NEGATIVE
Gonorrhea: NEGATIVE

## 2016-03-13 LAB — OB RESULTS CONSOLE RPR: RPR: NONREACTIVE

## 2016-03-13 LAB — OB RESULTS CONSOLE HIV ANTIBODY (ROUTINE TESTING): HIV: NONREACTIVE

## 2016-03-13 LAB — OB RESULTS CONSOLE ANTIBODY SCREEN: Antibody Screen: NEGATIVE

## 2016-08-05 NOTE — L&D Delivery Note (Signed)
Delivery Note At 4:04 PM a viable healthy female was delivered via  (Presentation: LOA  ).  APGAR: 9, 9; weight  pending.   Placenta status:sponaneous ,intact .  Cord:  with the following complications: none.  Cord pH: na  Anesthesia:  epidural Episiotomy:  none Lacerations:  none Suture Repair: na Est. Blood Loss (mL):  52  Mom to postpartum.  Baby to Couplet care / Skin to Skin.  Lucilia Yanni J 10/11/2016, 4:30 PM

## 2016-09-25 LAB — OB RESULTS CONSOLE GBS: STREP GROUP B AG: POSITIVE

## 2016-09-27 ENCOUNTER — Other Ambulatory Visit: Payer: Self-pay | Admitting: Obstetrics and Gynecology

## 2016-10-01 ENCOUNTER — Telehealth (HOSPITAL_COMMUNITY): Payer: Self-pay | Admitting: *Deleted

## 2016-10-01 NOTE — Telephone Encounter (Signed)
Preadmission screen  

## 2016-10-02 ENCOUNTER — Encounter (HOSPITAL_COMMUNITY): Payer: Self-pay | Admitting: *Deleted

## 2016-10-11 ENCOUNTER — Inpatient Hospital Stay (HOSPITAL_COMMUNITY): Payer: BLUE CROSS/BLUE SHIELD | Admitting: Anesthesiology

## 2016-10-11 ENCOUNTER — Inpatient Hospital Stay (HOSPITAL_COMMUNITY)
Admission: RE | Admit: 2016-10-11 | Discharge: 2016-10-12 | DRG: 775 | Disposition: A | Discharge status: Home / Self Care | Payer: BLUE CROSS/BLUE SHIELD | Source: Ambulatory Visit | Attending: Obstetrics and Gynecology | Admitting: Obstetrics and Gynecology

## 2016-10-11 ENCOUNTER — Encounter (HOSPITAL_COMMUNITY): Payer: Self-pay

## 2016-10-11 DIAGNOSIS — Z8249 Family history of ischemic heart disease and other diseases of the circulatory system: Secondary | ICD-10-CM | POA: Diagnosis not present

## 2016-10-11 DIAGNOSIS — R1031 Right lower quadrant pain: Secondary | ICD-10-CM

## 2016-10-11 DIAGNOSIS — Z3A39 39 weeks gestation of pregnancy: Secondary | ICD-10-CM

## 2016-10-11 DIAGNOSIS — O99824 Streptococcus B carrier state complicating childbirth: Secondary | ICD-10-CM | POA: Diagnosis present

## 2016-10-11 DIAGNOSIS — N9489 Other specified conditions associated with female genital organs and menstrual cycle: Secondary | ICD-10-CM

## 2016-10-11 DIAGNOSIS — Z3493 Encounter for supervision of normal pregnancy, unspecified, third trimester: Secondary | ICD-10-CM | POA: Diagnosis present

## 2016-10-11 LAB — CBC
HCT: 36.9 % (ref 36.0–46.0)
Hemoglobin: 12.8 g/dL (ref 12.0–15.0)
MCH: 31.4 pg (ref 26.0–34.0)
MCHC: 34.7 g/dL (ref 30.0–36.0)
MCV: 90.4 fL (ref 78.0–100.0)
PLATELETS: 134 10*3/uL — AB (ref 150–400)
RBC: 4.08 MIL/uL (ref 3.87–5.11)
RDW: 14.1 % (ref 11.5–15.5)
WBC: 7.4 10*3/uL (ref 4.0–10.5)

## 2016-10-11 LAB — TYPE AND SCREEN
ABO/RH(D): O POS
Antibody Screen: NEGATIVE

## 2016-10-11 MED ORDER — ONDANSETRON HCL 4 MG/2ML IJ SOLN
4.0000 mg | Freq: Four times a day (QID) | INTRAMUSCULAR | Status: DC | PRN
  Administered 2016-10-11 (×2): 4 mg via INTRAVENOUS
  Filled 2016-10-11 (×2): qty 2

## 2016-10-11 MED ORDER — TETANUS-DIPHTH-ACELL PERTUSSIS 5-2.5-18.5 LF-MCG/0.5 IM SUSP: 0.5000 mL | Freq: Once | INTRAMUSCULAR | Status: DC

## 2016-10-11 MED ORDER — OXYCODONE-ACETAMINOPHEN 5-325 MG PO TABS: 2.0000 | ORAL_TABLET | ORAL | Status: DC | PRN

## 2016-10-11 MED ORDER — COCONUT OIL OIL
1.0000 "application " | TOPICAL_OIL | Status: DC | PRN
  Administered 2016-10-11: 1 via TOPICAL
  Filled 2016-10-11: qty 120

## 2016-10-11 MED ORDER — SENNOSIDES-DOCUSATE SODIUM 8.6-50 MG PO TABS
2.0000 | ORAL_TABLET | ORAL | Status: DC
  Administered 2016-10-11: 2 via ORAL
  Filled 2016-10-11: qty 2

## 2016-10-11 MED ORDER — LIDOCAINE HCL (PF) 1 % IJ SOLN
30.0000 mL | INTRAMUSCULAR | Status: DC | PRN
  Filled 2016-10-11: qty 30

## 2016-10-11 MED ORDER — TERBUTALINE SULFATE 1 MG/ML IJ SOLN
0.2500 mg | Freq: Once | INTRAMUSCULAR | Status: DC | PRN
  Filled 2016-10-11: qty 1

## 2016-10-11 MED ORDER — LIDOCAINE HCL (PF) 1 % IJ SOLN
INTRAMUSCULAR | Status: DC | PRN
  Administered 2016-10-11 (×2): 6 mL via EPIDURAL

## 2016-10-11 MED ORDER — ZOLPIDEM TARTRATE 5 MG PO TABS: 5.0000 mg | ORAL_TABLET | Freq: Every evening | ORAL | Status: DC | PRN

## 2016-10-11 MED ORDER — PRENATAL MULTIVITAMIN CH
1.0000 | ORAL_TABLET | Freq: Every day | ORAL | Status: DC
  Administered 2016-10-12: 1 via ORAL
  Filled 2016-10-11: qty 1

## 2016-10-11 MED ORDER — DIPHENHYDRAMINE HCL 50 MG/ML IJ SOLN: 12.5000 mg | INTRAMUSCULAR | Status: DC | PRN

## 2016-10-11 MED ORDER — METHYLERGONOVINE MALEATE 0.2 MG PO TABS: 0.2000 mg | ORAL_TABLET | ORAL | Status: DC | PRN

## 2016-10-11 MED ORDER — SOD CITRATE-CITRIC ACID 500-334 MG/5ML PO SOLN: 30.0000 mL | ORAL | Status: DC | PRN

## 2016-10-11 MED ORDER — SIMETHICONE 80 MG PO CHEW: 80.0000 mg | CHEWABLE_TABLET | ORAL | Status: DC | PRN

## 2016-10-11 MED ORDER — OXYTOCIN BOLUS FROM INFUSION
500.0000 mL | Freq: Once | INTRAVENOUS | Status: AC
  Administered 2016-10-11: 500 mL via INTRAVENOUS

## 2016-10-11 MED ORDER — FENTANYL 2.5 MCG/ML BUPIVACAINE 1/10 % EPIDURAL INFUSION (WH - ANES)
14.0000 mL/h | INTRAMUSCULAR | Status: DC | PRN
  Administered 2016-10-11: 14 mL/h via EPIDURAL
  Filled 2016-10-11: qty 100

## 2016-10-11 MED ORDER — DIBUCAINE 1 % RE OINT: 1.0000 "application " | TOPICAL_OINTMENT | RECTAL | Status: DC | PRN

## 2016-10-11 MED ORDER — EPHEDRINE 5 MG/ML INJ
10.0000 mg | INTRAVENOUS | Status: DC | PRN
  Filled 2016-10-11: qty 4

## 2016-10-11 MED ORDER — OXYTOCIN 40 UNITS IN LACTATED RINGERS INFUSION - SIMPLE MED: 2.5000 [IU]/h | INTRAVENOUS | Status: DC

## 2016-10-11 MED ORDER — IBUPROFEN 600 MG PO TABS
600.0000 mg | ORAL_TABLET | Freq: Four times a day (QID) | ORAL | Status: DC
  Administered 2016-10-11 – 2016-10-12 (×4): 600 mg via ORAL
  Filled 2016-10-11 (×4): qty 1

## 2016-10-11 MED ORDER — LACTATED RINGERS IV SOLN: 500.0000 mL | INTRAVENOUS | Status: DC | PRN

## 2016-10-11 MED ORDER — ONDANSETRON HCL 4 MG/2ML IJ SOLN: 4.0000 mg | INTRAMUSCULAR | Status: DC | PRN

## 2016-10-11 MED ORDER — PENICILLIN G POT IN DEXTROSE 60000 UNIT/ML IV SOLN
3.0000 10*6.[IU] | INTRAVENOUS | Status: DC
  Administered 2016-10-11 (×2): 3 10*6.[IU] via INTRAVENOUS
  Filled 2016-10-11 (×5): qty 50

## 2016-10-11 MED ORDER — OXYTOCIN 40 UNITS IN LACTATED RINGERS INFUSION - SIMPLE MED
1.0000 m[IU]/min | INTRAVENOUS | Status: DC
  Administered 2016-10-11: 2 m[IU]/min via INTRAVENOUS
  Filled 2016-10-11: qty 1000

## 2016-10-11 MED ORDER — ACETAMINOPHEN 325 MG PO TABS: 650.0000 mg | ORAL_TABLET | ORAL | Status: DC | PRN

## 2016-10-11 MED ORDER — OXYCODONE-ACETAMINOPHEN 5-325 MG PO TABS
2.0000 | ORAL_TABLET | ORAL | Status: DC | PRN
  Administered 2016-10-11: 2 via ORAL
  Filled 2016-10-11: qty 2

## 2016-10-11 MED ORDER — PENICILLIN G POTASSIUM 5000000 UNITS IJ SOLR
5.0000 10*6.[IU] | Freq: Once | INTRAVENOUS | Status: AC
  Administered 2016-10-11: 5 10*6.[IU] via INTRAVENOUS
  Filled 2016-10-11: qty 5

## 2016-10-11 MED ORDER — METHYLERGONOVINE MALEATE 0.2 MG/ML IJ SOLN
0.2000 mg | Freq: Once | INTRAMUSCULAR | Status: AC
Start: 2016-10-11 — End: 2016-10-11
  Administered 2016-10-11: 0.2 mg via INTRAMUSCULAR
  Filled 2016-10-11: qty 1

## 2016-10-11 MED ORDER — DIPHENHYDRAMINE HCL 25 MG PO CAPS: 25.0000 mg | ORAL_CAPSULE | Freq: Four times a day (QID) | ORAL | Status: DC | PRN

## 2016-10-11 MED ORDER — FENTANYL CITRATE (PF) 100 MCG/2ML IJ SOLN: 50.0000 ug | INTRAMUSCULAR | Status: DC | PRN

## 2016-10-11 MED ORDER — OXYCODONE-ACETAMINOPHEN 5-325 MG PO TABS: 1.0000 | ORAL_TABLET | ORAL | Status: DC | PRN

## 2016-10-11 MED ORDER — PHENYLEPHRINE 40 MCG/ML (10ML) SYRINGE FOR IV PUSH (FOR BLOOD PRESSURE SUPPORT)
80.0000 ug | PREFILLED_SYRINGE | INTRAVENOUS | Status: DC | PRN
  Filled 2016-10-11: qty 5
  Filled 2016-10-11: qty 10

## 2016-10-11 MED ORDER — METHYLERGONOVINE MALEATE 0.2 MG/ML IJ SOLN: 0.2000 mg | INTRAMUSCULAR | Status: DC | PRN

## 2016-10-11 MED ORDER — WITCH HAZEL-GLYCERIN EX PADS: 1.0000 "application " | MEDICATED_PAD | CUTANEOUS | Status: DC | PRN

## 2016-10-11 MED ORDER — FLEET ENEMA 7-19 GM/118ML RE ENEM: 1.0000 | ENEMA | RECTAL | Status: DC | PRN

## 2016-10-11 MED ORDER — LACTATED RINGERS IV SOLN
500.0000 mL | Freq: Once | INTRAVENOUS | Status: AC
  Administered 2016-10-11: 500 mL via INTRAVENOUS

## 2016-10-11 MED ORDER — OXYCODONE-ACETAMINOPHEN 5-325 MG PO TABS
1.0000 | ORAL_TABLET | ORAL | Status: DC | PRN
  Administered 2016-10-11 – 2016-10-12 (×5): 1 via ORAL
  Filled 2016-10-11 (×5): qty 1

## 2016-10-11 MED ORDER — LACTATED RINGERS IV SOLN
INTRAVENOUS | Status: DC
  Administered 2016-10-11 (×2): via INTRAVENOUS

## 2016-10-11 MED ORDER — ONDANSETRON HCL 4 MG PO TABS: 4.0000 mg | ORAL_TABLET | ORAL | Status: DC | PRN

## 2016-10-11 MED ORDER — BENZOCAINE-MENTHOL 20-0.5 % EX AERO
1.0000 "application " | INHALATION_SPRAY | CUTANEOUS | Status: DC | PRN
  Administered 2016-10-11: 1 via TOPICAL
  Filled 2016-10-11: qty 56

## 2016-10-11 MED ORDER — PHENYLEPHRINE 40 MCG/ML (10ML) SYRINGE FOR IV PUSH (FOR BLOOD PRESSURE SUPPORT)
80.0000 ug | PREFILLED_SYRINGE | INTRAVENOUS | Status: DC | PRN
  Filled 2016-10-11: qty 5

## 2016-10-11 NOTE — Anesthesia Procedure Notes (Signed)
Epidural Patient location during procedure: OB Start time: 10/11/2016 11:21 AM End time: 10/11/2016 11:25 AM  Staffing Anesthesiologist: Leilani AbleHATCHETT, Garan Frappier Performed: anesthesiologist   Preanesthetic Checklist Completed: patient identified, surgical consent, pre-op evaluation, timeout performed, IV checked, risks and benefits discussed and monitors and equipment checked  Epidural Patient position: sitting Prep: site prepped and draped and DuraPrep Patient monitoring: continuous pulse ox and blood pressure Approach: midline Location: L3-L4 Injection technique: LOR air  Needle:  Needle type: Tuohy  Needle gauge: 17 G Needle length: 9 cm and 9 Needle insertion depth: 5 cm cm Catheter type: closed end flexible Catheter size: 19 Gauge Catheter at skin depth: 10 cm Test dose: negative and Other  Assessment Sensory level: T9 Events: blood not aspirated, injection not painful, no injection resistance, negative IV test and no paresthesia  Additional Notes Reason for block:procedure for pain

## 2016-10-11 NOTE — Anesthesia Pain Management Evaluation Note (Signed)
  CRNA Pain Management Visit Note  Patient: [name], 31 y.o., female  "Hello I am a member of the anesthesia team at [facility]. We have an anesthesia team available at all times to provide care throughout the hospital, including epidural management and anesthesia for C-section. I don't know your plan for the delivery whether it a natural birth, water birth, IV sedation, nitrous supplementation, doula or epidural, but we want to meet your pain goals."   1.Was your pain managed to your expectations on prior hospitalizations?   Yes   2.What is your expectation for pain management during this hospitalization?     Epidural  3.How can we help you reach that goal? Epidural when ready.  Record the patient's initial score and the patient's pain goal.   Pain: 1  Pain Goal: 3 [facility] wants you to be able to say your pain was always managed very well.  [name] L 10/11/2016

## 2016-10-11 NOTE — Progress Notes (Signed)
Laurie LewandowskyJessica Wood is a 31 y.o. G3P2002 at 5772w2d by LMP admitted for induction of labor due to Elective at term.  Subjective: Nausea  After epidural  Objective: BP (!) 92/48   Pulse 70   Resp 16   Ht 5' (1.524 m)   Wt 67.1 kg (148 lb)   LMP 01/03/2016   SpO2 100%   BMI 28.90 kg/m  No intake/output data recorded. No intake/output data recorded.  FHT:  Category 1UC:   irregular, every 2-6 minutes SVE:   Dilation: 5 Effacement (%): 60 Station: -3 Exam by:: Dr. Billy Coastaavon  AROM- clear  Labs: Lab Results  Component Value Date   WBC 7.4 10/11/2016   HGB 12.8 10/11/2016   HCT 36.9 10/11/2016   MCV 90.4 10/11/2016   PLT 134 (L) 10/11/2016    Assessment / Plan: Induction of labor due to term with favorable cervix,  progressing well on pitocin  Labor: Progressing normally Preeclampsia:  no signs or symptoms of toxicity Fetal Wellbeing:  Category I Pain Control:  Epidural I/D:  n/a Anticipated MOD:  NSVD  [name] J 10/11/2016, 1:02 PM

## 2016-10-11 NOTE — H&P (Signed)
Laurie LewandowskyJessica Wood is a 31 y.o. female presenting for IOL for history of rapid labor. OB History    Gravida Para Term Preterm AB Living   3 2 2     2    SAB TAB Ectopic Multiple Live Births           2     Past Medical History:  Diagnosis Date  . Adnexal mass 02/18/2016   Suspect hemorrhage in adnexa from right corpus luteum  . Medical history non-contributory   . RLQ abdominal pain 02/18/2016   Past Surgical History:  Procedure Laterality Date  . ANTERIOR CRUCIATE LIGAMENT REPAIR    . LAPAROSCOPY N/A 02/20/2016   Procedure: LAPAROSCOPY OPERATIVE, Right Salpingectomy with Removal Ectopic Pregnancy;  Surgeon: Olivia Mackieichard Lovell Roe, MD;  Location: [facility];  Service: Gynecology;  Laterality: N/A;   Family History: family history includes Cancer in her paternal grandfather; Heart attack in her paternal grandfather; Hypertension in her mother. Social History:  reports that she has never smoked. She has never used smokeless tobacco. She reports that she does not drink alcohol or use drugs.     Maternal Diabetes: No Genetic Screening: Normal Maternal Ultrasounds/Referrals: Normal Fetal Ultrasounds or other Referrals:  None Maternal Substance Abuse:  No Significant Maternal Medications:  None Significant Maternal Lab Results:  Lab values include: Group B Strep positive Other Comments:  None  Review of Systems  Constitutional: Negative.   All other systems reviewed and are negative.  Maternal Medical History:  Contractions: Onset was less than 1 hour ago.   Frequency: regular.   Perceived severity is mild.    Fetal activity: Perceived fetal activity is normal.   Last perceived fetal movement was within the past hour.    Prenatal complications: no prenatal complications Prenatal Complications - Diabetes: none.      Last menstrual period 01/03/2016, unknown if currently breastfeeding. Exam Physical Exam  Prenatal labs: ABO, Rh: O/Positive/-- (08/09 0000) Antibody: Negative (08/09  0000) Rubella: Immune (08/09 0000) RPR: Nonreactive (08/09 0000)  HBsAg: Negative (08/09 0000)  HIV: Non-reactive (08/09 0000)  GBS: Positive (02/21 0000)   Assessment/Plan: 39 wk IUP Favorable cervix History of heterotopic pregnancy IOL    [name] J 10/11/2016, 6:42 AM

## 2016-10-11 NOTE — Anesthesia Preprocedure Evaluation (Signed)

## 2016-10-11 NOTE — Progress Notes (Signed)
Patient's fundus firm/midline/-2,-3 with many golf ball sized clots noted at last fundal assessment. 213 mL blood loss with exam; Dr. Billy Coastaavon called, order for methergine given. Will continue to monitor patient closely.

## 2016-10-11 NOTE — Anesthesia Postprocedure Evaluation (Signed)
Anesthesia Post Note  Patient: [name]  Procedure(s) Performed: * No procedures listed *  Patient location during evaluation: Mother Baby Anesthesia Type: Epidural Level of consciousness: awake and alert Pain management: satisfactory to patient Vital Signs Assessment: post-procedure vital signs reviewed and stable Respiratory status: respiratory function stable Cardiovascular status: stable Postop Assessment: no headache, no backache, epidural receding, patient able to bend at knees, no signs of nausea or vomiting and adequate PO intake Anesthetic complications: no        Last Vitals:  Vitals:   10/11/16 2045 10/11/16 2145  BP: 117/67 (!) 120/59  Pulse: 60 61  Resp: 20 20  Temp: 36.6 C 36.9 C    Last Pain:  Vitals:   10/11/16 2145  TempSrc:   PainSc: 0-No pain   Pain Goal:                 [name] 

## 2016-10-12 ENCOUNTER — Encounter (HOSPITAL_COMMUNITY): Payer: Self-pay

## 2016-10-12 LAB — CBC
HCT: 35 % — ABNORMAL LOW (ref 36.0–46.0)
Hemoglobin: 12 g/dL (ref 12.0–15.0)
MCH: 31.3 pg (ref 26.0–34.0)
MCHC: 34.3 g/dL (ref 30.0–36.0)
MCV: 91.1 fL (ref 78.0–100.0)
PLATELETS: 127 10*3/uL — AB (ref 150–400)
RBC: 3.84 MIL/uL — AB (ref 3.87–5.11)
RDW: 14.1 % (ref 11.5–15.5)
WBC: 11.7 10*3/uL — AB (ref 4.0–10.5)

## 2016-10-12 LAB — RPR: RPR Ser Ql: NONREACTIVE

## 2016-10-12 MED ORDER — COCONUT OIL OIL: 1.0000 "application " | TOPICAL_OIL | 0 refills | Status: AC | PRN

## 2016-10-12 MED ORDER — IBUPROFEN 600 MG PO TABS: 600.0000 mg | ORAL_TABLET | Freq: Four times a day (QID) | ORAL | 0 refills | Status: AC

## 2016-10-12 MED ORDER — BENZOCAINE-MENTHOL 20-0.5 % EX AERO: 1.0000 "application " | INHALATION_SPRAY | CUTANEOUS | Status: AC | PRN

## 2016-10-12 MED ORDER — PSEUDOEPHEDRINE HCL 30 MG PO TABS
30.0000 mg | ORAL_TABLET | ORAL | Status: DC | PRN
  Administered 2016-10-12: 30 mg via ORAL
  Filled 2016-10-12: qty 1

## 2016-10-12 NOTE — Progress Notes (Signed)
PPD # 1 SVD Information for the patient's newborn:  [name], [name] [[id number]][[id number]]  female    Baby name: [name]  breast feeding -  Going well, feels comfortable.  BF other children 1 yr each.  Has been seen by lactation consultant - no concerns at this time.  S:  Reports feeling well and ready to go home.             Tolerating po/ No nausea or vomiting             Bleeding is light, no clots             Pain controlled with ibuprofen and Percocet. More cramping this time than last deliveries.  Breast symptoms: none  Denies dizziness/N/V/SOB/HA             Voiding without difficulties, passing gas    Up ad lib / ambulatory without difficulty      O:  A & O x 3, in no apparent distress              VS:  Vitals:   10/11/16 2045 10/11/16 2145 10/12/16 0200 10/12/16 0452  BP: 117/67 (!) 120/59 (!) 130/58 (!) 102/54  Pulse: 60 61 (!) 58 62  Resp: 20 20 20 18   Temp: 97.8 F (36.6 C) 98.4 F (36.9 C) 98.3 F (36.8 C) 97.6 F (36.4 C)  TempSrc:      SpO2:      Weight:      Height:        LABS:  Recent Labs  10/11/16 0740 10/12/16 0510  WBC 7.4 11.7*  HGB 12.8 12.0  HCT 36.9 35.0*  PLT 134* 127*    Blood type: --/--/O POS (03/09 0740)  Rubella: Immune (08/09 0000)   I&O: No intake/output data recorded.   General: alert, cooperative and no distress  Abdomen: soft, non-tender, non-distended             Fundus: firm, non-tender, U-1  Perineum: no edema, intact  Lochia: light  Extremities: trace edema, no calf pain or tenderness    A/P: PPD # 1 30 y.o., [id number]  Principal Problem:   Postpartum care following vaginal delivery (3/9) Active Problems:   Induction of Labor   Spontaneous vaginal delivery   Doing well - stable status  Routine post partum orders  Encouraged to continue ibuprofen once home.  Continue PNV as long as breastfeeding.  Aware of outpatient lactation services if needed.     Anticipate discharge today    [name], BSN,  SNM 10/12/2016, 8:59 AM

## 2016-10-12 NOTE — Discharge Summary (Addendum)
Obstetric Discharge Summary Reason for Admission: onset of labor Prenatal Procedures: ultrasound Intrapartum Procedures: spontaneous vaginal delivery and GBS prophylaxis x2 doses, epidural Postpartum Procedures: none Complications-Operative and Postpartum: none Hemoglobin  Date Value Ref Range Status  10/12/2016 12.0 12.0 - 15.0 g/dL Final   HCT  Date Value Ref Range Status  10/12/2016 35.0 (L) 36.0 - 46.0 % Final    Physical Exam:  General: alert, cooperative and no distress Lochia: appropriate Uterine Fundus: firm DVT Evaluation: No evidence of DVT seen on physical exam. Negative Homan's sign. No cords or calf tenderness. No significant calf/ankle edema.  Discharge Diagnoses: Term Pregnancy-delivered  Discharge Information: Date: 10/12/2016 Activity: unrestricted and pelvic rest Diet: routine Medications: PNV, Ibuprofen and Percocet Condition: stable Instructions: refer to practice specific booklet Discharge to: home Follow-up Information    Lenoard AdenAAVON,RICHARD J, MD. Schedule an appointment as soon as possible for a visit in 6 week(s).   Specialty:  Obstetrics and Gynecology Contact information: [address] [place] [address] [phone]           Newborn Data: Live born female - [name] Birth Weight: 7 lb 11.8 oz (3510 g) APGAR: 9, 9  Home with mother.  Kathlene Novemberinthya Bowmaker-Kareem, BSN, SNM 10/12/2016, 9:04 AM   Medical screening examination/treatment/procedure(s) were conducted as a shared visit with non-physician practitioner(s) and myself.  I personally evaluated the patient during the encounter.   Neta Mendsaniela C Cashlyn Huguley, CNM, MSN 10/12/2016, 9:49 AM

## 2016-10-12 NOTE — Lactation Note (Signed)
This note was copied from a baby's chart. Lactation Consultation Note  Patient Name: Girl Gala LewandowskyJessica Villena ZOXWR'UToday's Date: 10/12/2016 Reason for consult: Initial assessment Mom experienced BF and denies any questions/concerns. Latching baby in laid back position at this visit, baby demonstrates few good suckling bursts. Encouraged to continue to BF with feeding ques, 8-12 times or more in 24 hours. Cluster feeding reviewed. Pacifier use discussed. Lactation brochure left for review, advised of OP services and support group. Encouraged to call for assist as needed.   Maternal Data Has patient been taught Hand Expression?: No (Mom reports she knows how to hand express) Does the patient have breastfeeding experience prior to this delivery?: Yes  Feeding Feeding Type: Breast Fed Nipple Type: Slow - flow  LATCH Score/Interventions Latch: Grasps breast easily, tongue down, lips flanged, rhythmical sucking.  Audible Swallowing: A few with stimulation  Type of Nipple: Everted at rest and after stimulation  Comfort (Breast/Nipple): Soft / non-tender     Hold (Positioning): No assistance needed to correctly position infant at breast.  LATCH Score: 9  Lactation Tools Discussed/Used     Consult Status Consult Status: Complete    Alfred LevinsGranger, Sura Canul Ann 10/12/2016, 12:23 PM

## 2016-12-26 ENCOUNTER — Telehealth (HOSPITAL_COMMUNITY): Payer: Self-pay | Admitting: Lactation Services

## 2016-12-26 NOTE — Telephone Encounter (Signed)
Patient called. She is 10 weeks postpartum and she is experiencing her 3rd bout of mastitis. Each case of mastitis has occurred on the the R breast, which is the breast that makes more milk. Her infant has already doubled her birth weight. Striking a balance between supply/demand & not allowing milk to sit in breasts for too long discussed. Support provided; questions answered.   Laurie HewKim Solomiya Pascale, RN, IBCLC

## 2017-12-02 IMAGING — US US OB TRANSVAGINAL
1 series · 15 of 28 positions shown · non-contrast
Comparison: 02/18/2016 this morning.

CLINICAL DATA: 30-year-old pregnant female with right pelvic pain.
Findings suspicious for heterotopic pregnancy on ultrasound earlier
today.

EXAM:
TRANSVAGINAL OB ULTRASOUND
TECHNIQUE: Transvaginal ultrasound was performed for complete evaluation of the
gestation as well as the maternal uterus, adnexal regions, and
pelvic cul-de-sac.

[Series 2: us ob transvaginal · 46 acquisitions, 15 frames shown]
[im 1/46]
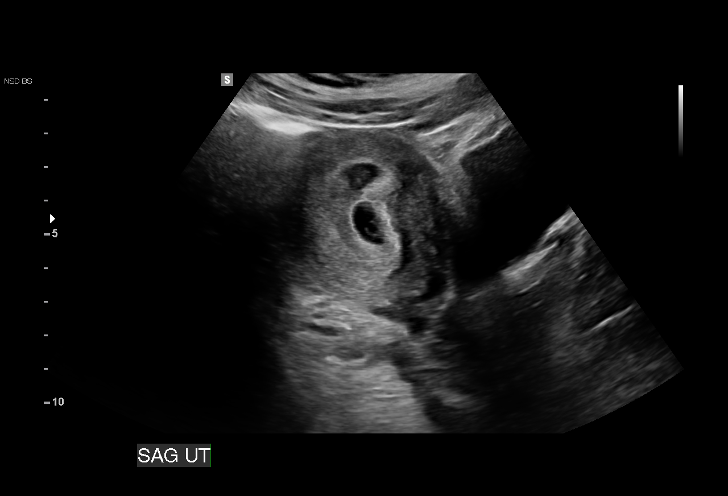
[im 4/46]
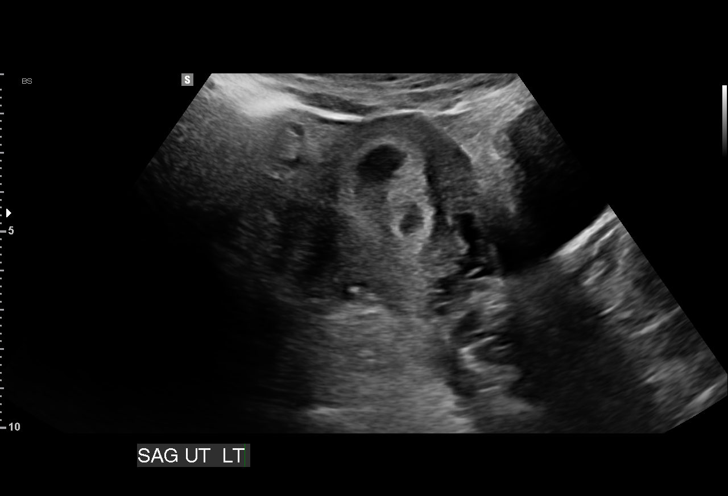
[im 7/46]
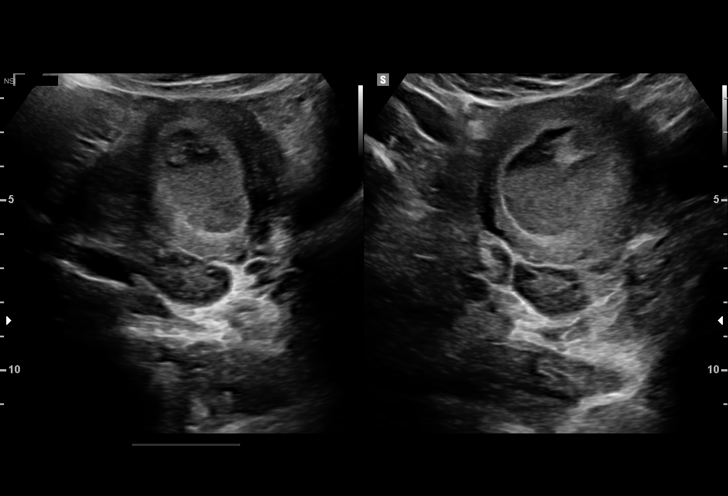
[im 11/46]
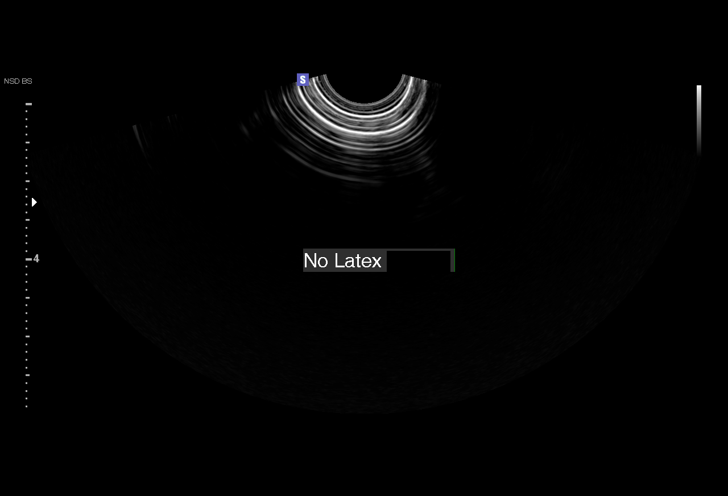
[im 14/46]
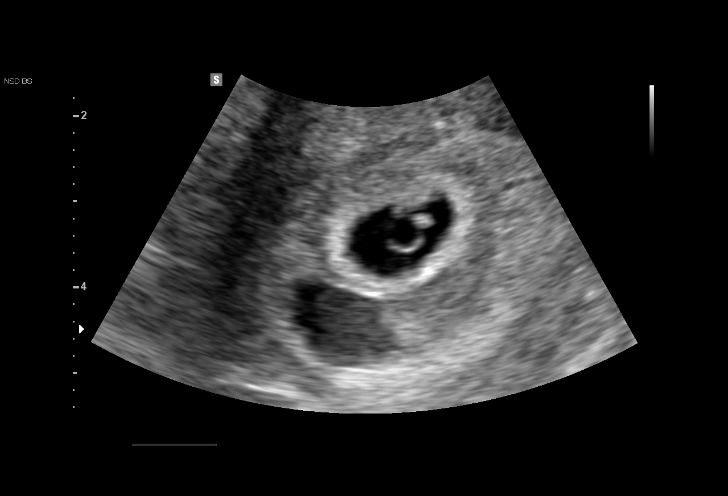
[im 17/46]
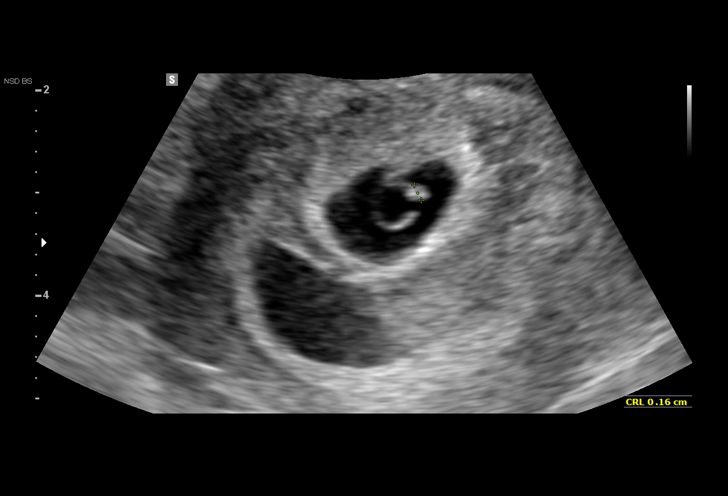
[im 21/46]
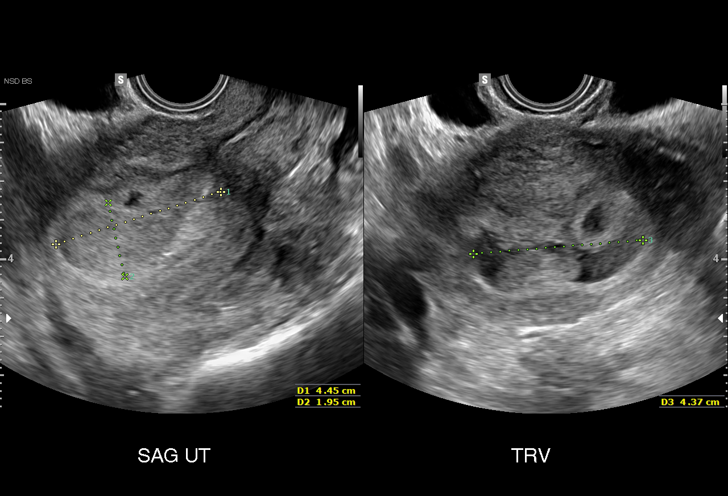
[im 24/46]
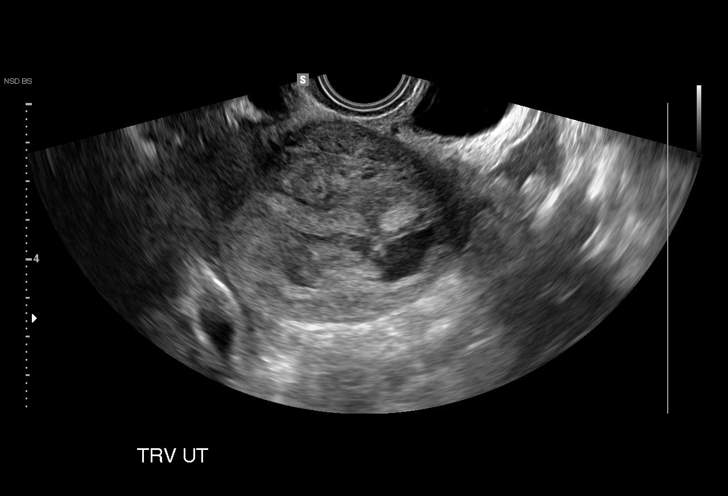
[im 26/46]
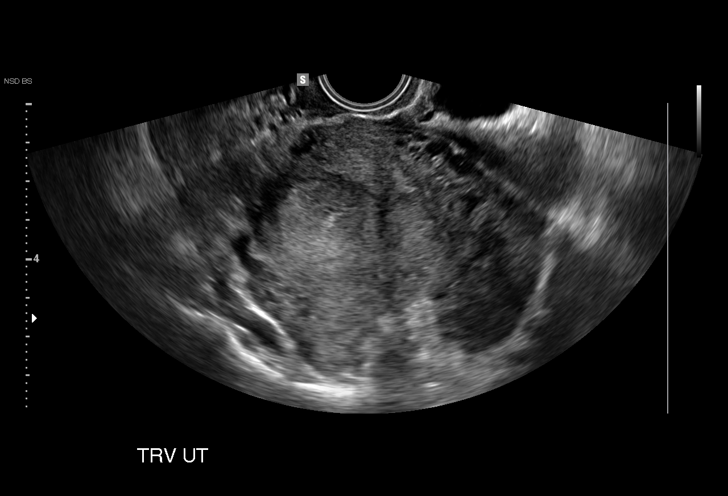
[im 29/46]
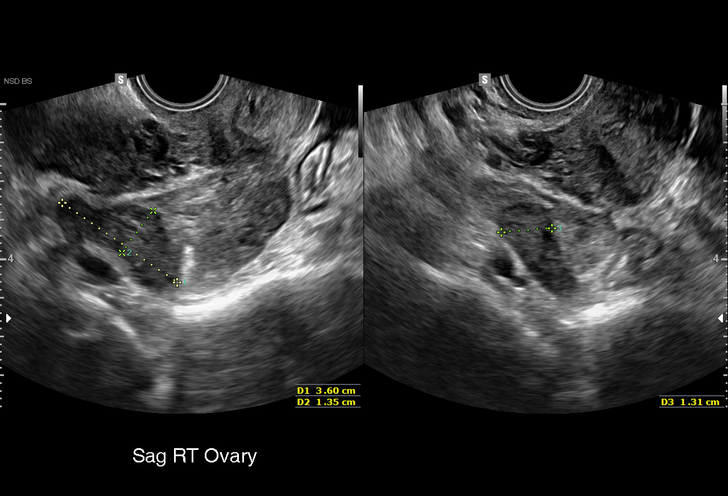
[im 32/46]
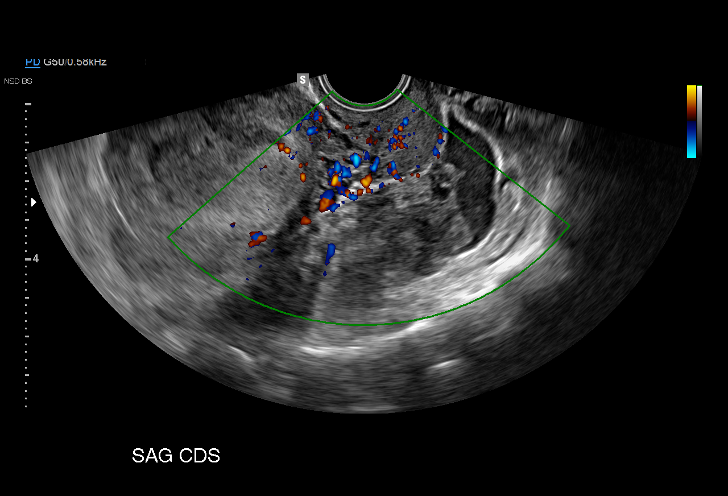
[im 36/46]
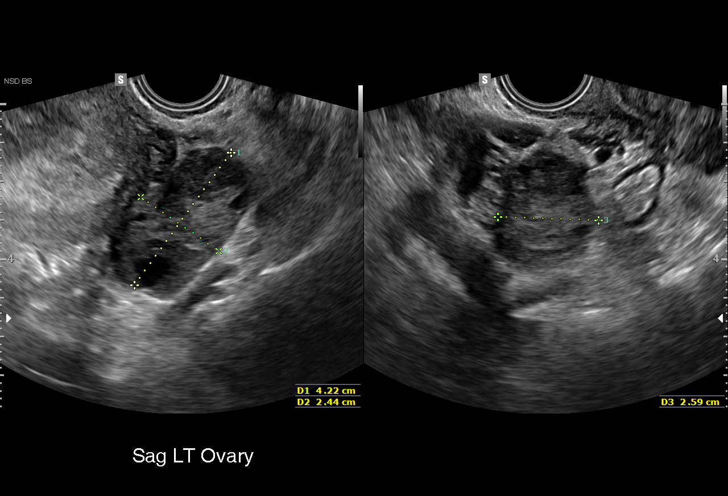
[im 39/46]
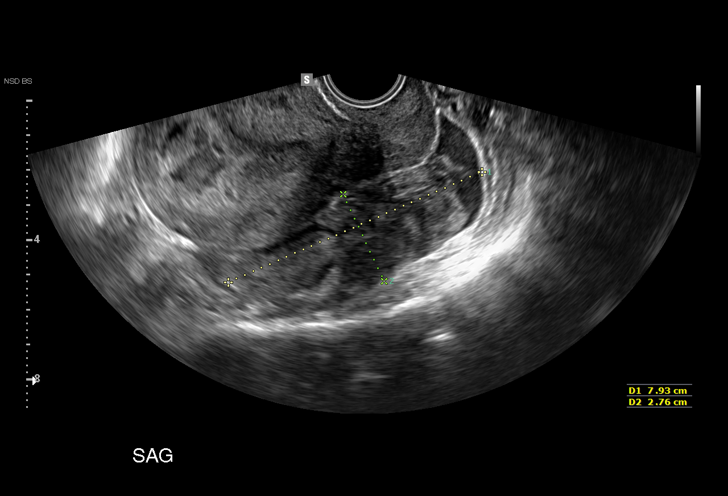
[im 42/46]
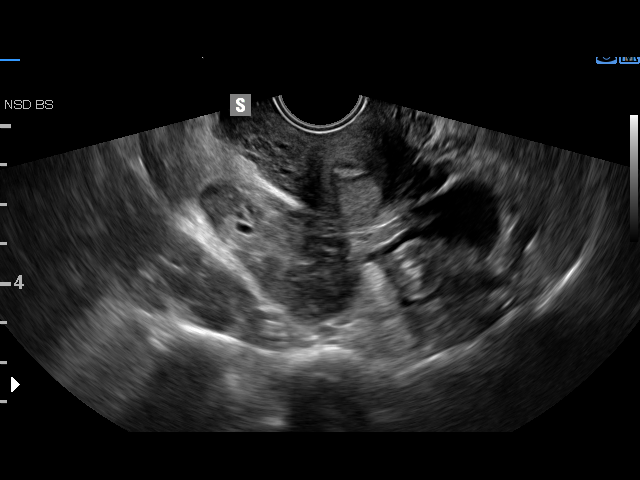
[im 46/46]
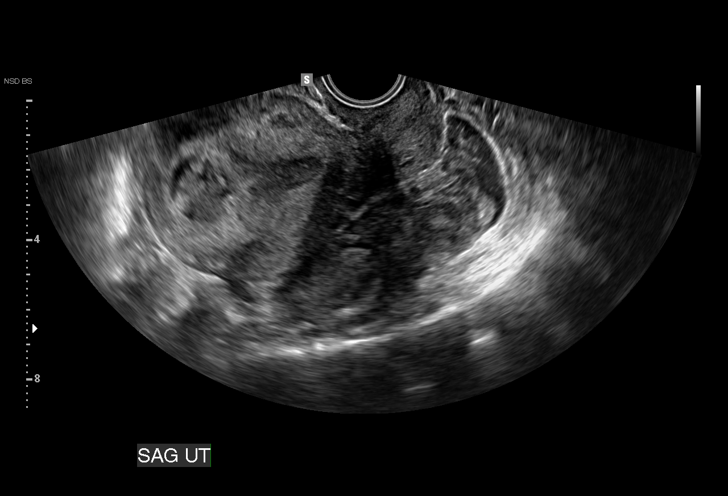

[15 of 28 positions shown; findings below may reference images not displayed]

FINDINGS: Intrauterine gestational sac: Visualize

Yolk sac:  Visualized

Embryo:  Visualized

Cardiac Activity: Possible

CRL:   1.8  mm

Subchorionic hemorrhage:  Large subchorionic hemorrhage.

Maternal uterus/adnexae: A 2.7 x 1.8 x 1.9 cm complex hyperechoic
structure in the right adnexal region is noted with slightly
increasing complex free pelvic fluid -suspicious for ectopic
pregnancy.

No other changes noted.
IMPRESSION: Single intrauterine gestational again identified with large
subchorionic hemorrhage.

Complex hypoechoic structure in the right adnexal region again noted
with slightly increasing complex free pelvic fluid -suspicious for
heterotopic/ concurrent ectopic pregnancy.

## 2017-12-02 IMAGING — US US OB TRANSVAGINAL
1 series · 15 of 28 positions shown · non-contrast
Comparison: None.

CLINICAL DATA: Intermittent right lower quadrant abdominal pain.
LMP was 01/03/2016. Quantitative beta HCG is pending.

EXAM:
OBSTETRIC <14 WK US AND TRANSVAGINAL OB US
TECHNIQUE: Both transabdominal and transvaginal ultrasound examinations were
performed for complete evaluation of the gestation as well as the
maternal uterus, adnexal regions, and pelvic cul-de-sac.
Transvaginal technique was performed to assess early pregnancy.

[Series 1: us ob transvaginal · 55 acquisitions, 15 frames shown]
[im 1/55]
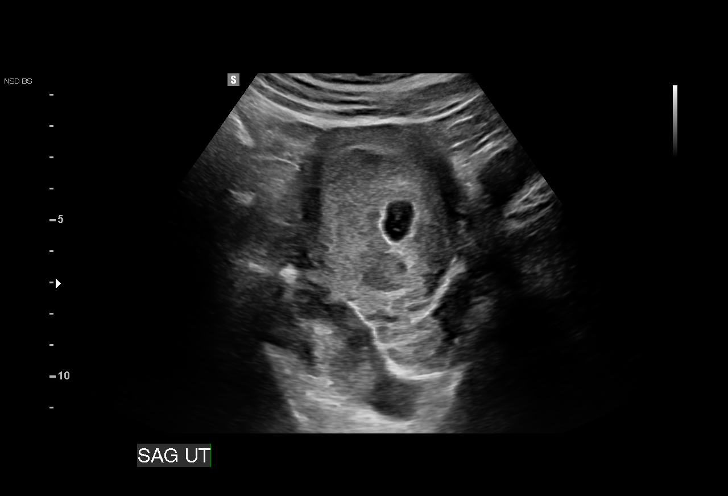
[im 5/55]
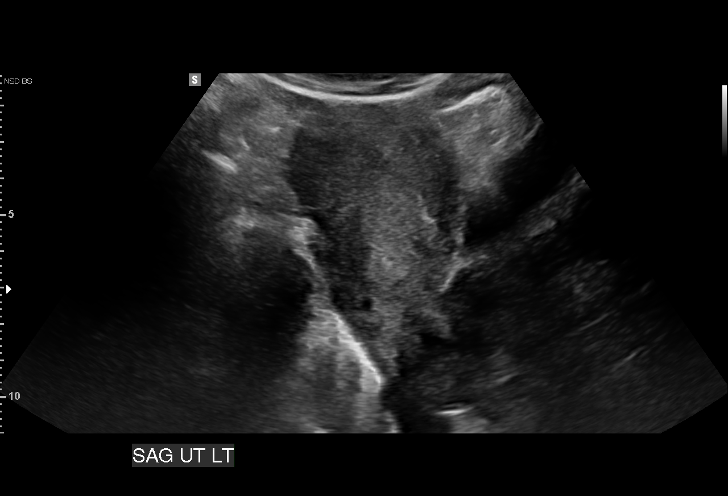
[im 9/55]
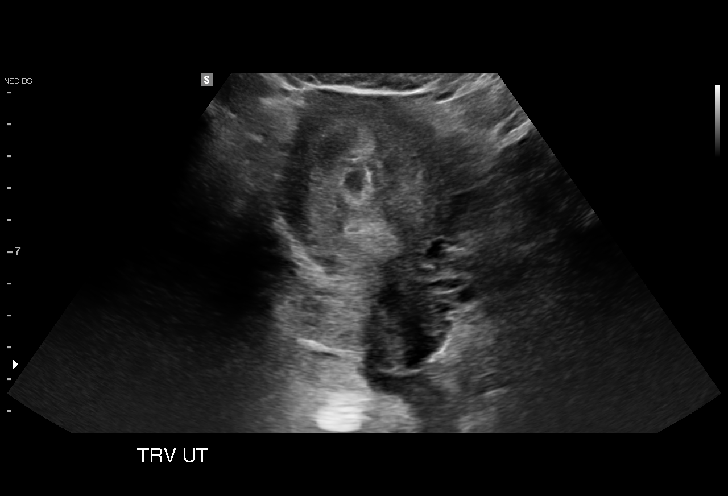
[im 13/55]
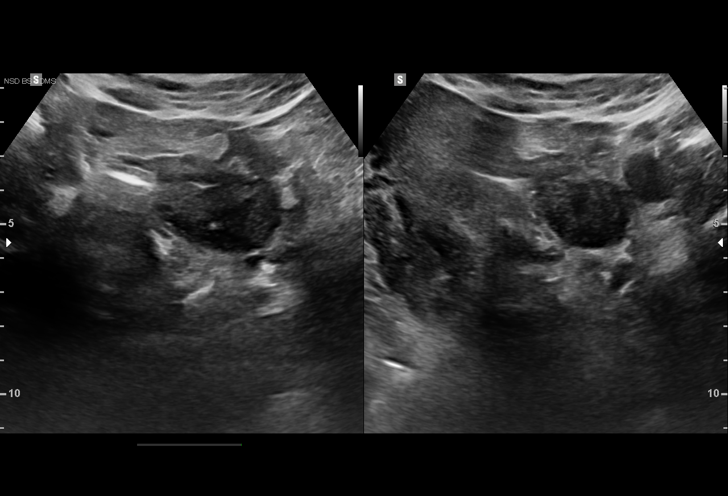
[im 17/55]
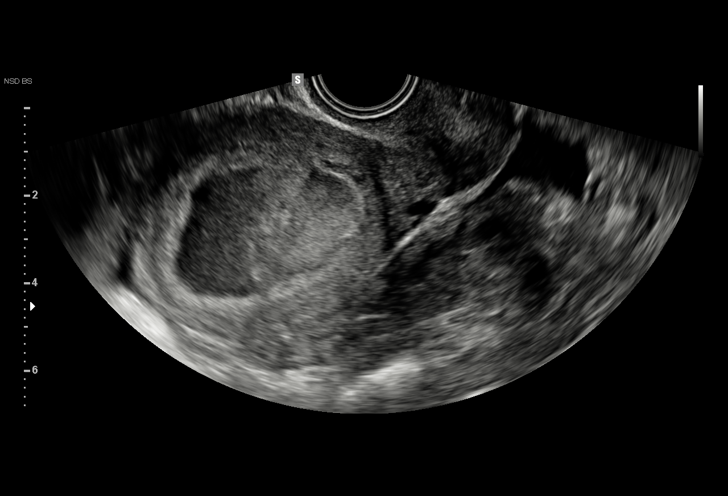
[im 21/55]
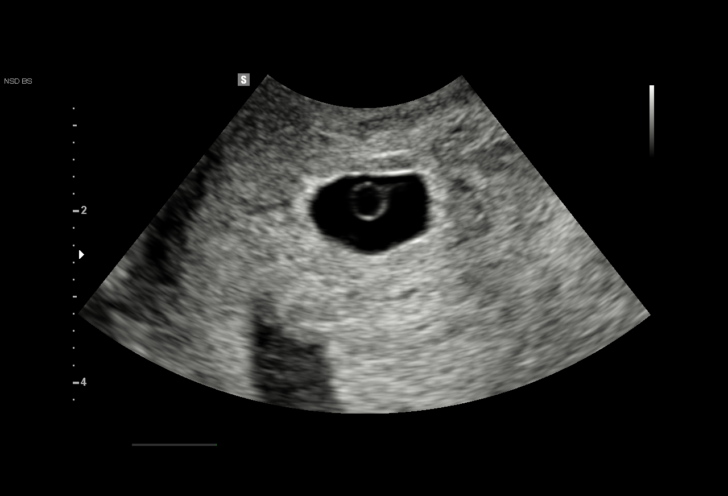
[im 25/55]
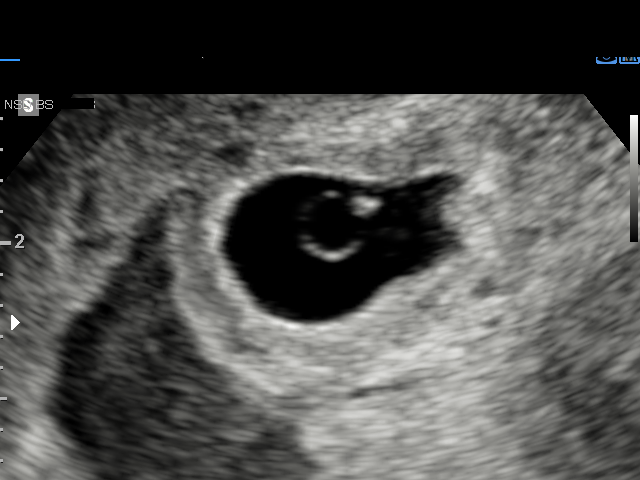
[im 29/55]
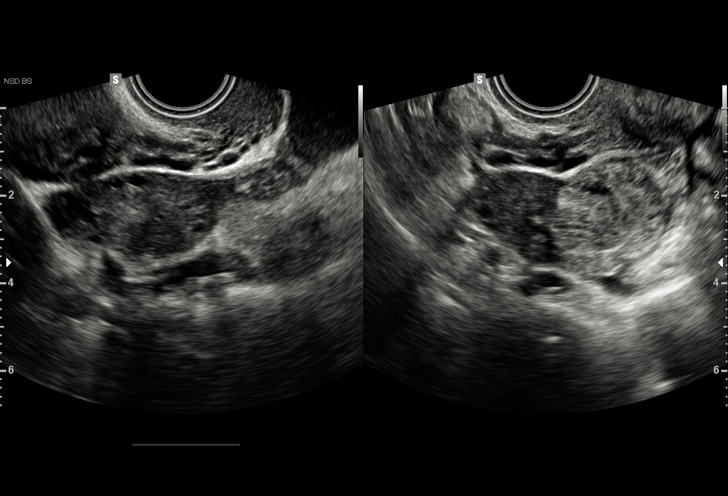
[im 31/55]
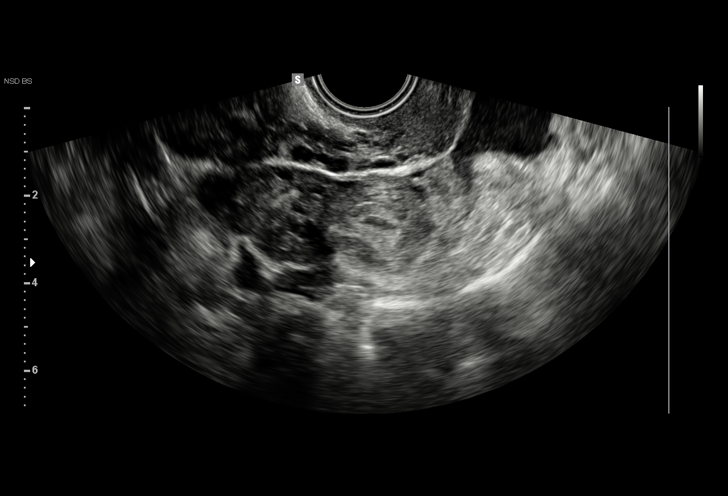
[im 35/55]
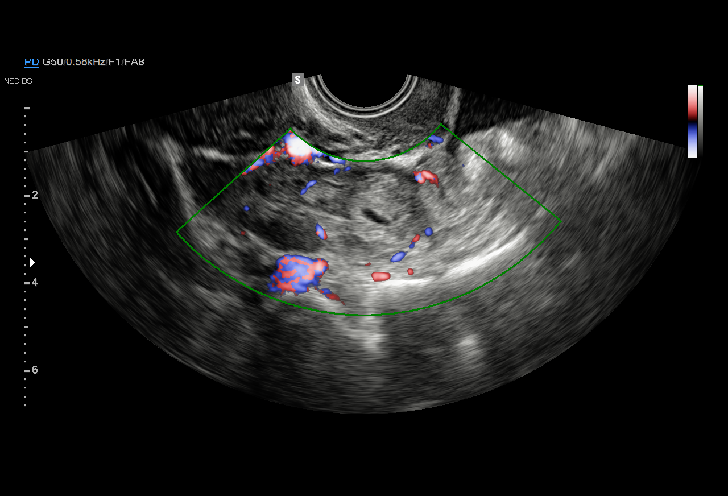
[im 39/55]
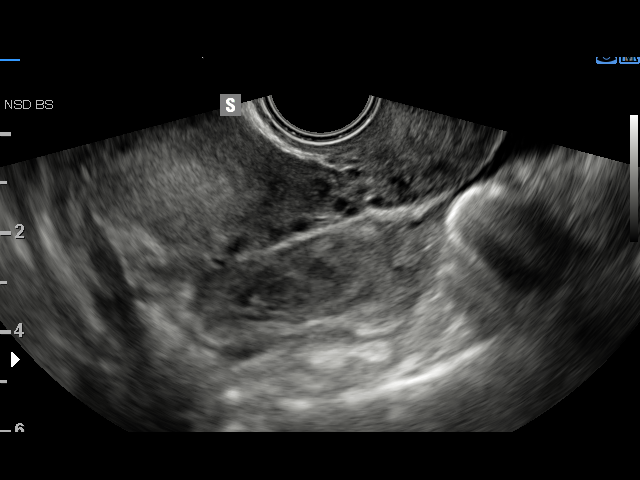
[im 43/55]
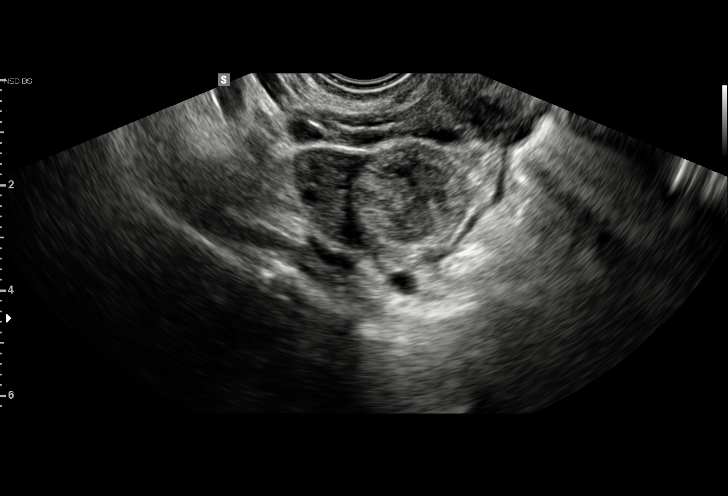
[im 47/55]
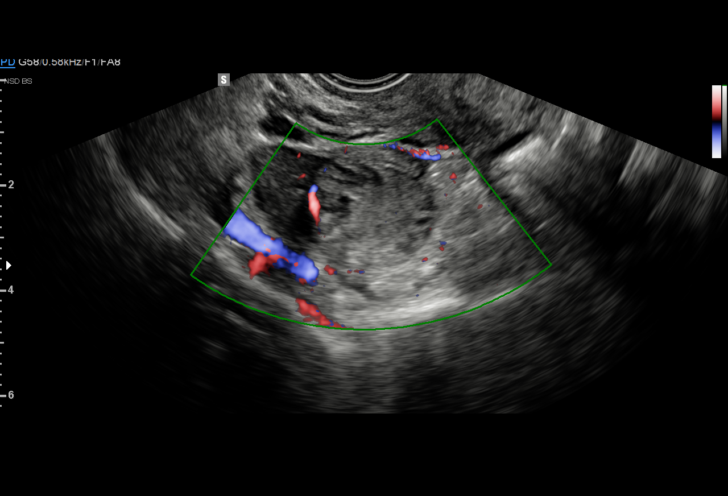
[im 51/55]
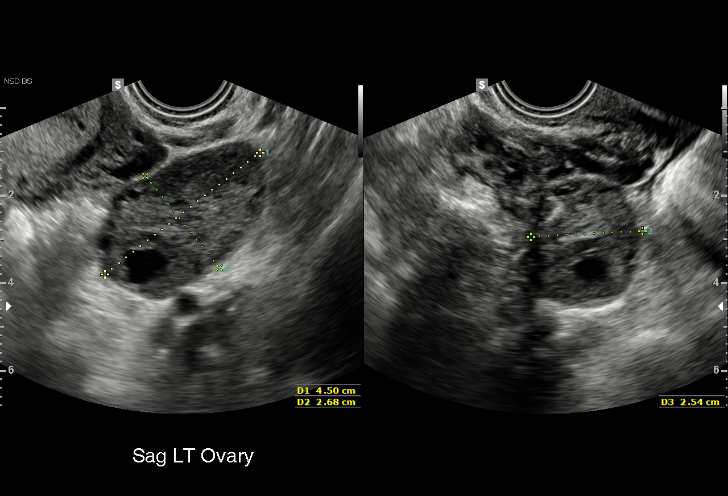
[im 55/55]
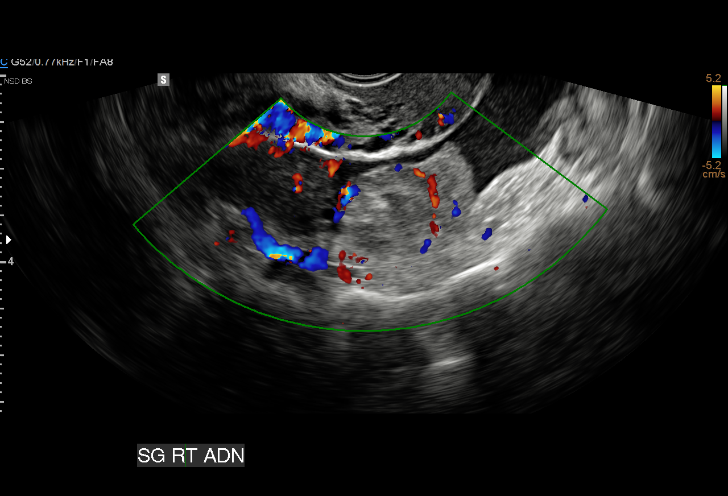

[15 of 28 positions shown; findings below may reference images not displayed]

FINDINGS: Intrauterine gestational sac: A single intrauterine gestational sac
is visualized.

Yolk sac:  Yolk sac is present.

Embryo:  A tiny fetal pole is identified.

Cardiac Activity: Not identified, likely due to small size.

CRL: 1.6 mm mm size was too small to register suggesting less than 5
week gestational age.

Subchorionic hemorrhage: A large subchorionic hemorrhage was
visualized. Measures 2.2 x 2.8 x 4 cm.

Maternal uterus/adnexae: The uterus is anteverted. No myometrial
mass lesions identified.

Both ovaries are visualized. The ovaries appear normal. In the right
adnexa, there is a heterogeneous mostly hyperechoic mass measuring 3
x 2.4 x 2.4 cm. Peripheral flow is demonstrated on color flow
Doppler imaging. Small central cystic collection. Appearance is
worrisome for ectopic pregnancy. No definitive yolk sac or fetal
pole was identified. Moderate amount of free fluid was demonstrated
in the pelvis. Fluid contains internal echoes, possibly hemorrhagic.
IMPRESSION: 1. A single intrauterine gestational is identified with tiny fetal
pole and yolk sac present. Crown-rump length is 2 small to register,
suggesting less than 5 weeks gestational age. Fetal cardiac activity
was not identified, likely due to small size. Large subchorionic
hemorrhage is present.
2. Complex hyperechoic mass with peripheral flow and central cystic
collection demonstrated in the right adnexum with complex free fluid
throughout the pelvis. Appearance is worrisome for a coexisting
ectopic pregnancy.
These results were called by telephone at the time of interpretation
on 02/18/2016 at [DATE] to TORANOSUKE TEI , who verbally
acknowledged these results.
